# Patient Record
Sex: Male | Born: 1960 | Race: White | Hispanic: Yes | Marital: Married
Health system: Southern US, Community
[De-identification: ages and names within clinical notes are randomized; demographics above are authoritative.]

## PROBLEM LIST (undated history)

## (undated) DIAGNOSIS — E119 Type 2 diabetes mellitus without complications: Secondary | ICD-10-CM

## (undated) DIAGNOSIS — E785 Hyperlipidemia, unspecified: Secondary | ICD-10-CM

## (undated) HISTORY — DX: Hyperlipidemia, unspecified: E78.5

## (undated) HISTORY — DX: Type 2 diabetes mellitus without complications: E11.9

---

## 2009-08-09 ENCOUNTER — Emergency Department (HOSPITAL_COMMUNITY): Admission: EM | Admit: 2009-08-09 | Discharge: 2009-08-09 | Payer: Self-pay | Admitting: Emergency Medicine
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
## 2021-04-24 ENCOUNTER — Telehealth: Payer: BC Managed Care – PPO | Admitting: Family

## 2021-04-24 DIAGNOSIS — M546 Pain in thoracic spine: Secondary | ICD-10-CM

## 2021-04-24 DIAGNOSIS — Z8616 Personal history of COVID-19: Secondary | ICD-10-CM | POA: Diagnosis not present

## 2021-04-24 DIAGNOSIS — R059 Cough, unspecified: Secondary | ICD-10-CM | POA: Diagnosis not present

## 2021-04-24 MED ORDER — PREDNISONE 10 MG (21) PO TBPK: ORAL_TABLET | ORAL | 0 refills | Status: DC

## 2021-04-24 MED ORDER — DICLOFENAC SODIUM 75 MG PO TBEC: 75.0000 mg | DELAYED_RELEASE_TABLET | Freq: Two times a day (BID) | ORAL | 0 refills | Status: DC

## 2021-04-24 MED ORDER — BACLOFEN 10 MG PO TABS: 10.0000 mg | ORAL_TABLET | Freq: Three times a day (TID) | ORAL | 0 refills | Status: DC | PRN

## 2021-04-24 NOTE — Progress Notes (Signed)
Virtual Visit Consent   Austin Singleton, you are scheduled for a virtual visit with a [facility] provider today.     Just as with appointments in the office, your consent must be obtained to participate.  Your consent will be active for this visit and any virtual visit you may have with one of our providers in the next 365 days.     If you have a MyChart account, a copy of this consent can be sent to you electronically.  All virtual visits are billed to your insurance company just like a traditional visit in the office.    As this is a virtual visit, video technology does not allow for your provider to perform a traditional examination.  This may limit your provider's ability to fully assess your condition.  If your provider identifies any concerns that need to be evaluated in person or the need to arrange testing (such as labs, EKG, etc.), we will make arrangements to do so.     Although advances in technology are sophisticated, we cannot ensure that it will always work on either your end or our end.  If the connection with a video visit is poor, the visit may have to be switched to a telephone visit.  With either a video or telephone visit, we are not always able to ensure that we have a secure connection.     I need to obtain your verbal consent now.   Are you willing to proceed with your visit today?    Adem Costlow has provided verbal consent on 04/24/2021 for a virtual visit (video or telephone).   Jannifer Rodney, FNP   Date: 04/24/2021 6:38 PM   Virtual Visit via Video Note   I, Jannifer Rodney, connected with  Austin Singleton  ([id number], Apr 24, 1961) on 04/24/21 at  6:30 PM EDT by a video-enabled telemedicine application and verified that I am speaking with the correct person using two identifiers.  Location: Patient: Virtual Visit Location Patient: Home Provider: Virtual Visit Location Provider: Home   I discussed the limitations of evaluation and management by telemedicine and the  availability of in person appointments. The patient expressed understanding and agreed to proceed.    History of Present Illness: Austin Singleton is a 60 y.o. who identifies as a male who was assigned male at birth, and is being seen today for cough and back pain pain. He had COVID on 04/18/21.  HPI: Cough This is a new problem. The current episode started 1 to 4 weeks ago. The problem has been waxing and waning. The problem occurs every few minutes. The cough is Productive of sputum. Associated symptoms include myalgias, nasal congestion and wheezing. Pertinent negatives include no chills, ear congestion, ear pain, fever, headaches or shortness of breath. Associated symptoms comments: Back pain .   Problems: There are no problems to display for this patient.   Allergies: Not on File Medications:  Current Outpatient Medications:    baclofen (LIORESAL) 10 MG tablet, Take 1 tablet (10 mg total) by mouth 3 (three) times daily as needed for muscle spasms., Disp: 30 each, Rfl: 0   diclofenac (VOLTAREN) 75 MG EC tablet, Take 1 tablet (75 mg total) by mouth 2 (two) times daily., Disp: 30 tablet, Rfl: 0   predniSONE (STERAPRED UNI-PAK 21 TAB) 10 MG (21) TBPK tablet, Use as directed, Disp: 21 tablet, Rfl: 0  Observations/Objective: Patient is well-developed, well-nourished in no acute distress.  Resting comfortably  at home.  Head is normocephalic, atraumatic.  No labored breathing.  Speech is clear and coherent with logical content.  Patient is alert and oriented at baseline.    Assessment and Plan: 1. History of COVID-19  2. Coughing - predniSONE (STERAPRED UNI-PAK 21 TAB) 10 MG (21) TBPK tablet; Use as directed  Dispense: 21 tablet; Refill: 0 - diclofenac (VOLTAREN) 75 MG EC tablet; Take 1 tablet (75 mg total) by mouth 2 (two) times daily.  Dispense: 30 tablet; Refill: 0 - baclofen (LIORESAL) 10 MG tablet; Take 1 tablet (10 mg total) by mouth 3 (three) times daily as needed for muscle  spasms.  Dispense: 30 each; Refill: 0  3. Acute bilateral thoracic back pain - predniSONE (STERAPRED UNI-PAK 21 TAB) 10 MG (21) TBPK tablet; Use as directed  Dispense: 21 tablet; Refill: 0 - diclofenac (VOLTAREN) 75 MG EC tablet; Take 1 tablet (75 mg total) by mouth 2 (two) times daily.  Dispense: 30 tablet; Refill: 0 - baclofen (LIORESAL) 10 MG tablet; Take 1 tablet (10 mg total) by mouth 3 (three) times daily as needed for muscle spasms.  Dispense: 30 each; Refill: 0  Rest ROM exercises encouraged No other NSAID's while taking diclofenac  Sedation  precautions discussed for baclofen Work note given If pain continues will need to be seen in person to rule out pneumonia. Red flags discussed.   Follow Up Instructions: I discussed the assessment and treatment plan with the patient. The patient was provided an opportunity to ask questions and all were answered. The patient agreed with the plan and demonstrated an understanding of the instructions.  A copy of instructions were sent to the patient via MyChart.  The patient was advised to call back or seek an in-person evaluation if the symptoms worsen or if the condition fails to improve as anticipated.  Time:  I spent 12 minutes with the patient via telehealth technology discussing the above problems/concerns.    Jannifer Rodney, FNP

## 2021-05-06 ENCOUNTER — Other Ambulatory Visit: Payer: Self-pay | Admitting: Family

## 2021-05-06 DIAGNOSIS — M546 Pain in thoracic spine: Secondary | ICD-10-CM

## 2021-05-06 DIAGNOSIS — R059 Cough, unspecified: Secondary | ICD-10-CM

## 2021-11-27 ENCOUNTER — Encounter: Payer: Self-pay | Admitting: Physical Therapy

## 2021-11-27 ENCOUNTER — Ambulatory Visit: Payer: BC Managed Care – PPO | Admitting: Physical Therapy

## 2021-11-27 DIAGNOSIS — M542 Cervicalgia: Secondary | ICD-10-CM | POA: Insufficient documentation

## 2021-11-27 DIAGNOSIS — M5413 Radiculopathy, cervicothoracic region: Secondary | ICD-10-CM | POA: Diagnosis present

## 2021-11-27 NOTE — Therapy (Signed)
?OUTPATIENT PHYSICAL THERAPY CERVICAL EVALUATION ? ? ?Patient Name: Austin Singleton ?MRN: [id number] ?DOB:[birth year], 61 y.o., male ?Today's Date: 11/27/2021 ? ? PT End of Session - 11/27/21 1026   ? ? Visit Number 1   ? Number of Visits 13   ? Date for PT Re-Evaluation 01/22/22   ? PT Start Time 1015   ? PT Stop Time 1101   ? PT Time Calculation (min) 46 min   ? Activity Tolerance Patient tolerated treatment well   ? Behavior During Therapy [facility] for tasks assessed/performed   ? ?  ?  ? ?  ? ? ?History reviewed. No pertinent past medical history. ?History reviewed. No pertinent surgical history. ?There are no problems to display for this patient. ? ? ?PCP: No primary care provider on file. ? ?REFERRING PROVIDER: Glendale Chard., MD ? ?REFERRING DIAG: Radiculopathy, cervical region [M54.12] ? ?THERAPY DIAG:  ?Cervicalgia ? ?Radiculopathy, cervicothoracic region ? ?ONSET DATE: January 2023 for neck pain, RUE pain & N/T x 4 years ? ?SUBJECTIVE:                                                                                                                                                                                                        ? ?SUBJECTIVE STATEMENT: ?Pt reports neck pain starting after doing a lot of lifting at work in January 2023 when he was lifting at work that was between 20 - 50# which he had to lift repetitively. He has a hx of RUE pain and N/T that was exacerbated after the event in January. Per pt's wife she reports there has been a number of accidents that have happened through the years starting in  201, and 2012 he had some equipment fall on him. In 2020 he was working on some equipment and the top cover hit him in the head / shoulder. He reports pain stays in the neck on the R side but when working he can feel it in both sides. And N/T into in his RUE and into the hands worse in the morning but improves throughout the day. He reports the N/T seems to be worsening.  ? ?PERTINENT HISTORY:   ?Anxiety ? ?PAIN:  ?Are you having pain? Yes: NPRS scale: 8/10 ?Pain location: R side of th eneck, deltoid, and bicep ?Pain description: burning ?Aggravating factors: getting up in the morning, laying down, doing activity, raising hands over head ?Relieving factors: Taking meds, stretch  ? ?PRECAUTIONS: None ? ?WEIGHT BEARING RESTRICTIONS No ? ?FALLS:  ?Has patient fallen in last 6 months? No ? ?LIVING  ENVIRONMENT: ?Lives with: lives with their family ?Lives in: House/apartment ? ?OCCUPATION: working at a plant ? ?PLOF: Independent with basic ADLs ? ?PATIENT GOALS To decrease pain ? ?OBJECTIVE:  ? ?DIAGNOSTIC FINDINGS:  ?N/A ? ?PATIENT SURVEYS:  ?FOTO 37%, predicted 56% ? ? ?COGNITION: ?Overall cognitive status: Within functional limits for tasks assessed ? ? ?SENSATION: ?WFL ? ?POSTURE:  ?Forwared head posture and keeps head lateral leaned to the L ? ?PALPATION: ?TTP along the R upper trap/ levator scapuale and cerivcal parapsinals with multiple trigger points noted .  ? ?CERVICAL ROM:  ? ?Active ROM A/PROM (deg) ?11/27/2021  ?Flexion 30 P!  ?Extension 30 P!  ?Right lateral flexion 20 P!  ?Left lateral flexion 40  ?Right rotation 55 P!  ?Left rotation 70  ? (Blank rows = not tested) ? ?UE ROM: ? ?Active ROM Right ?11/27/2021 Left ?11/27/2021  ?Shoulder flexion [facility]  ?Shoulder extension [facility]  ?[facility] abduction [facility]  ?Shoulder adduction    ?Shoulder extension [facility]  ?Shoulder internal rotation [facility]  ?Shoulder external rotation [facility]  ?Elbow flexion    ?Elbow extension    ?Wrist flexion    ?Wrist extension    ?Wrist ulnar deviation    ?Wrist radial deviation    ?Wrist pronation    ?Wrist supination    ? (Blank rows = not tested) ? ?UE MMT: ? ?MMT Right ?11/27/2021 Left ?11/27/2021  ?Shoulder flexion 4/5 4/5  ?Shoulder extension    ?Shoulder abduction 4/5 P! 4/5  ?Shoulder adduction    ?Shoulder extension 4/5 4/5  ?Shoulder internal rotation 4-/5 4+/5  ?Shoulder external rotation 4/5 4/5  ?Middle  trapezius    ?Lower trapezius    ?Elbow flexion    ?Elbow extension    ?Wrist flexion    ?Wrist extension    ?Wrist ulnar deviation    ?Wrist radial deviation    ?Wrist pronation    ?Wrist supination    ?Grip strength    ? (Blank rows = not tested) ? ?CERVICAL SPECIAL TESTS:  ?Upper limb tension test (ULTT): Negative, Spurling's test: Positive, and Distraction test: Positive ? ? ? ? ? ? ? ?[facility]  [facility]                                                [facility]27[facility]20[facility] ? ?Therapeutic Exercise: ?Seated chin tuck 1 x 5 holding 10 seconds (demosntration for proper form ?Upper trap/ levator scapulae stretch 2 x 30 sec ?Scapular retraction 1 x 5 holding 5 seconds ?Manual Therapy: ?MTPR along the R upper trap / levator scapulae ?Sub-occipital release ?Self Care: ?Reviewed posture and keeping his head in a nuetral postion in sitting to prevent issues / over use. ? ? ? ?PATIENT EDUCATION:  ?Education details: evaluation findings, POC, goals, HEP with proper form ?Person educated: Patient ?Education method: Explanation, Verbal cues, and Handouts ?Education comprehension: verbalized understanding ? ? ?HOME EXERCISE PROGRAM: ?Access Code: [email or web address] ?URL: [email or web address] ?Date: 11/27/2021 ?Prepared by: Lulu RidingKristoffer Kayona Foor ? ?Exercises ?- Seated Upper Trapezius Stretch  - 1 x daily - 7 x weekly - 3 sets - 2 reps - 30 seconds hold ?- Gentle Levator Scapulae Stretch  - 1 x daily - 7 x weekly - 2 sets - 2 reps - 30 seconds hold ?- Standing Cervical Retraction  - 1 x daily -  7 x weekly - 2 sets - 10 reps ?- Supine Chin Tuck with Towel  - 1 x daily - 7 x weekly - 2 sets - 10 reps - 5 hold ?- Seated Scapular Retraction  - 1 x daily - 7 x weekly - 2 sets - 10 reps - 5 seconds hold ? ?ASSESSMENT: ? ?CLINICAL IMPRESSION: ?Patient is a 61 y.o. M who was seen today for physical therapy evaluation and treatment for Neck pain starting in January reporting exacerbation of neck pain and RUE n/t that  has been present since 2011 which has occurred from work related incidents. He reports N/T that occurs in the morning and improves throughout the day light tough assessment revealed no differenced between L/R UE. He demonstrates limited cervical mobility with R sidebending and R rotation with report of pain with concordant symptoms located at the upper trap. He has TTP along the R upper trap and levator / cervical paraspinals with trigger points, he responded to 3/4 cervical radiculopathy cluster testing suggesting high liklihood of cervical radiculopathy. He would benefit from physical therapy to decrease neck pain, reduce muscle spasm, improve mobility, promote / postural efficiency and maximize his function by addressing the deficits listed.  ? ? ?OBJECTIVE IMPAIRMENTS decreased activity tolerance, decreased endurance, decreased ROM, decreased strength, increased fascial restrictions, increased muscle spasms, improper body mechanics, postural dysfunction, and pain.  ? ?ACTIVITY LIMITATIONS occupation.  ? ?PERSONAL FACTORS Age and 1 comorbidity: time since onset with mulitple injuries  are also affecting patient's functional outcome.  ? ? ?REHAB POTENTIAL: Good ? ?CLINICAL DECISION MAKING: Evolving/moderate complexity ? ?EVALUATION COMPLEXITY: Moderate ? ? ?GOALS: ?Goals reviewed with patient? Yes ? ?SHORT TERM GOALS: Target date: 12/18/2021 ? ?Pt to be IND with initial HEP to assist with therapeutic progression ?Baseline:  ?Goal status: INITIAL ? ?2.  Pt to verbalize and demo efficient posture and lifting mechanics to reduce and prevent neck/ RUE pain ?Baseline:  ?Goal status: INITIAL ? ?LONG TERM GOALS: Target date: 01/08/2022 ? ?Pt to increase R cervical rotation and Sidebending by >/= 10 degrees and maintain all other cervical ROM with </= 4/10 max pain to assist with work related activities and safety with driving  ?Baseline:  ?Goal status: INITIAL ? ?2.  Increase gross shoulder strength to >/= 4+/5 to assist  with functional and efficient lifting mechancis and neck/ shoulder stability  ?Baseline:  ?Goal status: INITIAL ? ?3.  Increase FOTO score to >/= 56% to demo improvement in function ?Baseline:  ?Goal status: INITIAL ?

## 2021-12-04 ENCOUNTER — Encounter: Payer: Self-pay | Admitting: Physical Therapy

## 2021-12-04 ENCOUNTER — Ambulatory Visit: Payer: BC Managed Care – PPO | Admitting: Physical Therapy

## 2021-12-04 DIAGNOSIS — M542 Cervicalgia: Secondary | ICD-10-CM | POA: Diagnosis not present

## 2021-12-04 DIAGNOSIS — M5413 Radiculopathy, cervicothoracic region: Secondary | ICD-10-CM | POA: Insufficient documentation

## 2021-12-04 NOTE — Therapy (Signed)
?OUTPATIENT PHYSICAL THERAPY TREATMENT NOTE ? ? ?Patient Name: Austin Singleton ?MRN: [id number] ?DOB:[birth year], 61 y.o., male ?Today's Date: 12/04/2021 ?  ?PCP: No primary care provider on file. ?  ?REFERRING PROVIDER: Trenda Moots., MD ? ?END OF SESSION:  ? PT End of Session - 12/04/21 0802   ? ? Visit Number 2   ? Number of Visits 13   ? Date for PT Re-Evaluation 01/22/22   ? PT Start Time 0800   ? PT Stop Time 0850   ? PT Time Calculation (min) 50 min   ? ?  ?  ? ?  ? ? ?History reviewed. No pertinent past medical history. ?History reviewed. No pertinent surgical history. ?There are no problems to display for this patient. ? ? ?REFERRING DIAG: Radiculopathy, cervical region [M54.12] ? ?THERAPY DIAG:  ?Cervicalgia ? ?Radiculopathy, cervicothoracic region ? ?PERTINENT HISTORY: Anxiety  ? ?PRECAUTIONS: None  ? ?SUBJECTIVE: I have been doing the exercises. I still have pain. 8/10.  ? ?PAPAIN:  ?Are you having pain? Yes: NPRS scale: 8/10 ?Pain location: R side of th eneck, deltoid, and bicep ?Pain description: burning ?Aggravating factors: getting up in the morning, laying down, doing activity, raising hands over head ?Relieving factors: Taking meds, stretch  ? ? ?OBJECTIVE: (objective measures completed at initial evaluation unless otherwise dated) ? ? ?OBJECTIVE:  ?  ?DIAGNOSTIC FINDINGS:  ?N/A ?  ?PATIENT SURVEYS:  ?FOTO 37%, predicted 56% ?  ?  ?COGNITION: ?Overall cognitive status: Within functional limits for tasks assessed ?  ?  ?SENSATION: ?WFL ?  ?POSTURE:  ?Forwared head posture and keeps head lateral leaned to the L ?  ?PALPATION: ?TTP along the R upper trap/ levator scapuale and cerivcal parapsinals with multiple trigger points noted .   ?  ?CERVICAL ROM:  ?  ?Active ROM A/PROM (deg) ?11/27/2021  ?Flexion 30 P!  ?Extension 30 P!  ?Right lateral flexion 20 P!  ?Left lateral flexion 40  ?Right rotation 55 P!  ?Left rotation 70  ? (Blank rows = not tested) ?  ?UE ROM: ?  ?Active ROM Right ?11/27/2021  Left ?11/27/2021  ?Shoulder flexion [facility] WFL  ?Shoulder extension [facility] WFL  ?Shoulder abduction [facility] WFL  ?Shoulder adduction      ?Shoulder extension [facility] WFL  ?Shoulder internal rotation [facility] WFL  ?Shoulder external rotation [facility] WFL  ?Elbow flexion      ?Elbow extension      ?Wrist flexion      ?Wrist extension      ?Wrist ulnar deviation      ?Wrist radial deviation      ?Wrist pronation      ?Wrist supination      ? (Blank rows = not tested) ?  ?UE MMT: ?  ?MMT Right ?11/27/2021 Left ?11/27/2021  ?Shoulder flexion 4/5 4/5  ?Shoulder extension      ?Shoulder abduction 4/5 P! 4/5  ?Shoulder adduction      ?Shoulder extension 4/5 4/5  ?Shoulder internal rotation 4-/5 4+/5  ?Shoulder external rotation 4/5 4/5  ?Middle trapezius      ?Lower trapezius      ?Elbow flexion      ?Elbow extension      ?Wrist flexion      ?Wrist extension      ?Wrist ulnar deviation      ?Wrist radial deviation      ?Wrist pronation      ?Wrist supination      ?Grip strength      ? (  Blank rows = not tested) ?  ?CERVICAL SPECIAL TESTS:  ?Upper limb tension test (ULTT): Negative, Spurling's test: Positive, and Distraction test: Positive ?  ?  ?  ?  ?  ?  ?  ?TODAY'S TREATMENT:  ?[facility] Adult PT Treatment:                                                DATE: 12/04/2021 ?  ?Therapeutic Exercise: ?Seated chin tuck 1 x 20 holding 5 seconds (demosntration for proper form ?Upper trap/ levator scapulae stretch 3 x 30 sec ?Scapular retraction 1 x 20 holding 5 seconds ?Supine cervical retraction into towel 5 sec 1 x 20 ?Manual Therapy: ?MTPR along the R upper trap / levator scapulae ?Sub-occipital release ?Passive cervical ROM side bend and rotation ?Cervical manual traction -reports decreased pain in right arm  ?Modalities:  ?Cervical Mechanical Traction -  10 minutes (static hold 30 lbs) 6 steps at 5 sec each up and down at 15 degrees  cervical flexion. (No change in arm pain with up to 30 lb pull -reported decreased right neck and upper trap pain during  treatment).  ? ? ?[facility] Adult PT Treatment:                                                DATE: 11/27/2021 ?  ?Therapeutic Exercise: ?Seated chin tuck 1 x 5 holding 10 seconds (demosntration for proper form ?Upper trap/ levator scapulae stretch 2 x 30 sec ?Scapular retraction 1 x 5 holding 5 seconds ?Manual Therapy: ?MTPR along the R upper trap / levator scapulae ?Sub-occipital release ?Self Care: ?Reviewed posture and keeping his head in a nuetral postion in sitting to prevent issues / over use. ?  ?  ?  ?PATIENT EDUCATION:  ?Education details: evaluation findings, POC, goals, HEP with proper form ?Person educated: Patient ?Education method: Explanation, Verbal cues, and Handouts ?Education comprehension: verbalized understanding ?  ?  ?HOME EXERCISE PROGRAM: ?Access Code: [email or web address] ?URL: [email or web address] ?Date: 11/27/2021 ?Prepared by: Starr Lake ?  ?Exercises ?- Seated Upper Trapezius Stretch  - 1 x daily - 7 x weekly - 3 sets - 2 reps - 30 seconds hold ?- Gentle Levator Scapulae Stretch  - 1 x daily - 7 x weekly - 2 sets - 2 reps - 30 seconds hold ?- Standing Cervical Retraction  - 1 x daily - 7 x weekly - 2 sets - 10 reps ?- Supine Chin Tuck with Towel  - 1 x daily - 7 x weekly - 2 sets - 10 reps - 5 hold ?- Seated Scapular Retraction  - 1 x daily - 7 x weekly - 2 sets - 10 reps - 5 seconds hold ?  ?ASSESSMENT: ?  ?CLINICAL IMPRESSION: ?Patient is a 61 y.o. M who was seen today for his first physical therapy treatment for Neck pain starting in January reporting exacerbation of neck pain and RUE n/t that has been present since 2011 which has occurred from work related incidents. He reports compliance with HEP and no change in symptoms. Reviewed HEP and he demonstrates independence with all therex. Continued with cervical PROM and TPR to right upper trap.  Began manual cervical traction which he report feeling improvement in pain.  Trial of mechanical traction performed today to assess  response. No initial improvement in right arm pain today. He would benefit from physical therapy to decrease neck pain, reduce muscle spasm, improve mobility, promote / postural efficiency and maximize his function by addressing the deficits listed.  ?  ?  ?OBJECTIVE IMPAIRMENTS decreased activity tolerance, decreased endurance, decreased ROM, decreased strength, increased fascial restrictions, increased muscle spasms, improper body mechanics, postural dysfunction, and pain.  ?  ?ACTIVITY LIMITATIONS occupation.  ?  ?PERSONAL FACTORS Age and 1 comorbidity: time since onset with mulitple injuries  are also affecting patient's functional outcome.  ?  ?  ?REHAB POTENTIAL: Good ?  ?CLINICAL DECISION MAKING: Evolving/moderate complexity ?  ?EVALUATION COMPLEXITY: Moderate ?  ?  ?GOALS: ?Goals reviewed with patient? Yes ?  ?SHORT TERM GOALS: Target date: 12/18/2021 ?  ?Pt to be IND with initial HEP to assist with therapeutic progression ?Baseline:  ?Goal status: MET 12/04/21 ?  ?2.  Pt to verbalize and demo efficient posture and lifting mechanics to reduce and prevent neck/ RUE pain ?Baseline:  ?Goal status: INITIAL ?  ?LONG TERM GOALS: Target date: 01/08/2022 ?  ?Pt to increase R cervical rotation and Sidebending by >/= 10 degrees and maintain all other cervical ROM with </= 4/10 max pain to assist with work related activities and safety with driving  ?Baseline:  ?Goal status: INITIAL ?  ?2.  Increase gross shoulder strength to >/= 4+/5 to assist with functional and efficient lifting mechancis and neck/ shoulder stability  ?Baseline:  ?Goal status: INITIAL ?  ?3.  Increase FOTO score to >/= 56% to demo improvement in function ?Baseline:  ?Goal status: INITIAL ?  ?4.  Pt to be report no RUE N/T symptoms for >/= 2 weeks to demo improvement in function ?Baseline:  ?Goal status: INITIAL ?  ?5.  Pt to be able to return to work and resume regular activity per pt's personal goals.  ?Baseline:  ?Goal status: INITIAL ?  ?6.  Pt to be  IND with all HEP to maintain and progress current LOF IND ?Baseline:  ?Goal status: INITIAL ?  ?  ?PLAN: ?PT FREQUENCY: 1-2x/week ?  ?PT DURATION: 6 weeks ?  ?PLANNED INTERVENTIONS: Therapeutic exercises, Therapeutic ac

## 2021-12-10 ENCOUNTER — Ambulatory Visit: Payer: BC Managed Care – PPO | Admitting: Physical Therapy

## 2021-12-10 ENCOUNTER — Encounter: Payer: Self-pay | Admitting: Physical Therapy

## 2021-12-10 DIAGNOSIS — M5413 Radiculopathy, cervicothoracic region: Secondary | ICD-10-CM

## 2021-12-10 DIAGNOSIS — M542 Cervicalgia: Secondary | ICD-10-CM

## 2021-12-10 NOTE — Therapy (Signed)
?OUTPATIENT PHYSICAL THERAPY TREATMENT NOTE ? ? ?Patient Name: Austin Singleton ?MRN: [id number] ?DOB:1960/09/11, 61 y.o., male ?Today's Date: 12/10/2021 ?  ?PCP: No primary care provider on file. ?  ?REFERRING PROVIDER: Trenda Moots., MD ? ?END OF SESSION:  ? PT End of Session - 12/10/21 1456   ? ? Visit Number 3   ? Number of Visits 13   ? Date for PT Re-Evaluation 01/22/22   ? PT Start Time 2130   ? PT Stop Time 1550   ? PT Time Calculation (min) 51 min   ? Activity Tolerance Patient tolerated treatment well   ? Behavior During Therapy [facility] for tasks assessed/performed   ? ?  ?  ? ?  ? ? ? ?History reviewed. No pertinent past medical history. ?History reviewed. No pertinent surgical history. ?There are no problems to display for this patient. ? ? ?REFERRING DIAG: Radiculopathy, cervical region [M54.12] ? ?THERAPY DIAG:  ?Cervicalgia ? ?Radiculopathy, cervicothoracic region ? ?PERTINENT HISTORY: Anxiety  ? ?PRECAUTIONS: None  ? ?SUBJECTIVE: "after the last session the referred symptoms down the arm went way for about 3 days, the pain returned back today. I am able to drive better and hold the wheel better." ? ?PAIN:  ?Are you having pain? Yes: NPRS scale: 8/10 ?Pain location: R side of th eneck, deltoid, and bicep ?Pain description: burning ?Aggravating factors: getting up in the morning, laying down, doing activity, raising hands over head ?Relieving factors: Taking meds, stretch  ? ? ?OBJECTIVE: (objective measures completed at initial evaluation unless otherwise dated) ? ? ?OBJECTIVE:  ?  ?DIAGNOSTIC FINDINGS:  ?N/A ?  ?PATIENT SURVEYS:  ?FOTO 37%, predicted 56% ?  ?  ?COGNITION: ?Overall cognitive status: Within functional limits for tasks assessed ?  ?  ?SENSATION: ?WFL ?  ?POSTURE:  ?Forwared head posture and keeps head lateral leaned to the L ?  ?PALPATION: ?TTP along the R upper trap/ levator scapuale and cerivcal parapsinals with multiple trigger points noted .   ?  ?CERVICAL ROM:  ?  ?Active ROM A/PROM  (deg) ?11/27/2021  ?Flexion 30 P!  ?Extension 30 P!  ?Right lateral flexion 20 P!  ?Left lateral flexion 40  ?Right rotation 55 P!  ?Left rotation 70  ? (Blank rows = not tested) ?  ?UE ROM: ?  ?Active ROM Right ?11/27/2021 Left ?11/27/2021  ?Shoulder flexion [facility] WFL  ?Shoulder extension [facility] WFL  ?Shoulder abduction [facility] WFL  ?Shoulder adduction      ?Shoulder extension [facility] WFL  ?Shoulder internal rotation [facility] WFL  ?Shoulder external rotation [facility] WFL  ?Elbow flexion      ?Elbow extension      ?Wrist flexion      ?Wrist extension      ?Wrist ulnar deviation      ?Wrist radial deviation      ?Wrist pronation      ?Wrist supination      ? (Blank rows = not tested) ?  ?UE MMT: ?  ?MMT Right ?11/27/2021 Left ?11/27/2021  ?Shoulder flexion 4/5 4/5  ?Shoulder extension      ?Shoulder abduction 4/5 P! 4/5  ?Shoulder adduction      ?Shoulder extension 4/5 4/5  ?Shoulder internal rotation 4-/5 4+/5  ?Shoulder external rotation 4/5 4/5  ?Middle trapezius      ?Lower trapezius      ?Elbow flexion      ?Elbow extension      ?Wrist flexion      ?Wrist extension      ?  Wrist ulnar deviation      ?Wrist radial deviation      ?Wrist pronation      ?Wrist supination      ?Grip strength      ? (Blank rows = not tested) ?  ?CERVICAL SPECIAL TESTS:  ?Upper limb tension test (ULTT): Negative, Spurling's test: Positive, and Distraction test: Positive ?  ?  ?  ?  ?  ?TODAY'S TREATMENT:  ? ?[place] Adult PT Treatment:                                                DATE: 12/10/2021 ?Therapeutic Exercise: ?UBE L4 x 4 min (FWD/BWD x 2 min ea) ?Upper trap/ levator scapulae stretch 1 x 30 sec ea on R only ?Chin tuck head lift 10 x holding 15 seconds ?Horizontal abduction 2 x 15 with GTB  ?Manual Therapy: ?Bil first rib inferior mobs grade IV ?T1-T9 PA grade IV with multiple cavitation noted ?IASTM along the R upper trap/ levator scapulae ? ?Trigger Point Dry-Needling  ?Treatment instructions: Expect mild to moderate muscle soreness. S/S of pneumothorax if  dry needled over a lung field, and to seek immediate medical attention should they occur. Patient verbalized understanding of these instructions and education. ? ?Patient Consent Given: Yes ?Education handout provided: Yes ?Muscles treated: R upper trap  ?Electrical stimulation performed: No ?Parameters: N/A ?Treatment response/outcome: twitch response and reduction tension noted ?Modalities: ?Cervical Mechanical Traction -  10 minutes Max 30 lbs, min 22lbs 6 steps at 10 sec each up and down at 15 degrees  cervical flexion. ? ? ?[facility] Adult PT Treatment:                                                DATE: 12/04/2021 ?Therapeutic Exercise: ?Seated chin tuck 1 x 20 holding 5 seconds (demosntration for proper form ?Upper trap/ levator scapulae stretch 3 x 30 sec ?Scapular retraction 1 x 20 holding 5 seconds ?Supine cervical retraction into towel 5 sec 1 x 20 ?Manual Therapy: ?MTPR along the R upper trap / levator scapulae ?Sub-occipital release ?Passive cervical ROM side bend and rotation ?Cervical manual traction -reports decreased pain in right arm  ?Modalities:  ?Cervical Mechanical Traction -  10 minutes (static hold 30 lbs) 6 steps at 5 sec each up and down at 15 degrees  cervical flexion. (No change in arm pain with up to 30 lb pull -reported decreased right neck and upper trap pain during treatment).  ? ? ?[facility] Adult PT Treatment:                                                DATE: 11/27/2021 ?  ?Therapeutic Exercise: ?Seated chin tuck 1 x 5 holding 10 seconds (demosntration for proper form ?Upper trap/ levator scapulae stretch 2 x 30 sec ?Scapular retraction 1 x 5 holding 5 seconds ?Manual Therapy: ?MTPR along the R upper trap / levator scapulae ?Sub-occipital release ?Self Care: ?Reviewed posture and keeping his head in a nuetral postion in sitting to prevent issues / over use. ?  ?  ?  ?PATIENT  EDUCATION:  ?Education details: evaluation findings, POC, goals, HEP with proper form ?Person educated:  Patient ?Education method: Explanation, Verbal cues, and Handouts ?Education comprehension: verbalized understanding ?  ?  ?HOME EXERCISE PROGRAM: ?Access Code: [email or web address] ?URL: [email or web address] ?Date: 11/27/2021 ?Prepared by: Starr Lake ?  ?Exercises ?- Seated Upper Trapezius Stretch  - 1 x daily - 7 x weekly - 3 sets - 2 reps - 30 seconds hold ?- Gentle Levator Scapulae Stretch  - 1 x daily - 7 x weekly - 2 sets - 2 reps - 30 seconds hold ?- Standing Cervical Retraction  - 1 x daily - 7 x weekly - 2 sets - 10 reps ?- Supine Chin Tuck with Towel  - 1 x daily - 7 x weekly - 2 sets - 10 reps - 5 hold ?- Seated Scapular Retraction  - 1 x daily - 7 x weekly - 2 sets - 10 reps - 5 seconds hold ?  ?ASSESSMENT: ?  ?CLINICAL IMPRESSION: ?Pt arrives to session reporting improvement in his N/T but continues to report pain located at the upper trap/ levator scapulae. Educated and consent was given for TPDN focusing on the R upper trap followed with IASTM. Continued working on DNF progressing to chin tuck head lift, and posterior should strengthening. Conitnued using mechanical traction which he continued to report improvement in pain with pain dropping to 6/10 compared to an 8/10 at the beginning of the session.  ?  ?  ?OBJECTIVE IMPAIRMENTS decreased activity tolerance, decreased endurance, decreased ROM, decreased strength, increased fascial restrictions, increased muscle spasms, improper body mechanics, postural dysfunction, and pain.  ?  ?ACTIVITY LIMITATIONS occupation.  ?  ?PERSONAL FACTORS Age and 1 comorbidity: time since onset with mulitple injuries  are also affecting patient's functional outcome.  ?  ?  ?REHAB POTENTIAL: Good ?  ?CLINICAL DECISION MAKING: Evolving/moderate complexity ?  ?EVALUATION COMPLEXITY: Moderate ?  ?  ?GOALS: ?Goals reviewed with patient? Yes ?  ?SHORT TERM GOALS: Target date: 12/18/2021 ?  ?Pt to be IND with initial HEP to assist with therapeutic  progression ?Baseline:  ?Goal status: MET 12/04/21 ?  ?2.  Pt to verbalize and demo efficient posture and lifting mechanics to reduce and prevent neck/ RUE pain ?Baseline:  ?Goal status: INITIAL ?  ?LONG TERM GOALS: Target date: 01/08/2022 ?  ?

## 2021-12-12 ENCOUNTER — Ambulatory Visit: Payer: BC Managed Care – PPO | Admitting: Physical Therapy

## 2021-12-12 ENCOUNTER — Encounter: Payer: Self-pay | Admitting: Physical Therapy

## 2021-12-12 DIAGNOSIS — M5413 Radiculopathy, cervicothoracic region: Secondary | ICD-10-CM

## 2021-12-12 DIAGNOSIS — M542 Cervicalgia: Secondary | ICD-10-CM | POA: Diagnosis not present

## 2021-12-12 NOTE — Therapy (Signed)
?OUTPATIENT PHYSICAL THERAPY TREATMENT NOTE ? ? ?Patient Name: Austin Singleton ?MRN: [id number] ?DOB:July 07, 1961, 61 y.o., male ?Today's Date: 12/12/2021 ?  ?PCP: No primary care provider on file. ?  ?REFERRING PROVIDER: Glendale Chard., MD ? ?END OF SESSION:  ? PT End of Session - 12/12/21 1416   ? ? Visit Number 4   ? Number of Visits 13   ? Date for PT Re-Evaluation 01/22/22   ? PT Start Time 1413   ? PT Stop Time 1510   ? PT Time Calculation (min) 57 min   ? Activity Tolerance Patient tolerated treatment well   ? Behavior During Therapy [facility] for tasks assessed/performed   ? ?  ?  ? ?  ? ? ? ? ?History reviewed. No pertinent past medical history. ?History reviewed. No pertinent surgical history. ?There are no problems to display for this patient. ? ? ?REFERRING DIAG: Radiculopathy, cervical region [M54.12] ? ?THERAPY DIAG:  ?Cervicalgia ? ?Radiculopathy, cervicothoracic region ? ?PERTINENT HISTORY: Anxiety  ? ?PRECAUTIONS: None ?SUBJECTIVE: "The TPDN helped and the traction helped too,  ? ?PAIN:  ?Are you having pain? Yes: NPRS scale: 6/10 ?Pain location: R side of th eneck, deltoid, and bicep ?Pain description: burning ?Aggravating factors: getting up in the morning, laying down, doing activity, raising hands over head ?Relieving factors: Taking meds, stretch  ? ? ?OBJECTIVE: (objective measures completed at initial evaluation unless otherwise dated) ? ? ?OBJECTIVE:  ?  ?DIAGNOSTIC FINDINGS:  ?N/A ?  ?PATIENT SURVEYS:  ?FOTO 37%, predicted 56% ?  ?  ?COGNITION: ?Overall cognitive status: Within functional limits for tasks assessed ?  ?  ?SENSATION: ?WFL ?  ?POSTURE:  ?Forwared head posture and keeps head lateral leaned to the L ?  ?PALPATION: ?TTP along the R upper trap/ levator scapuale and cerivcal parapsinals with multiple trigger points noted .   ?  ?CERVICAL ROM:  ?  ?Active ROM A/PROM (deg) ?11/27/2021  ?Flexion 30 P!  ?Extension 30 P!  ?Right lateral flexion 20 P!  ?Left lateral flexion 40  ?Right rotation 55  P!  ?Left rotation 70  ? (Blank rows = not tested) ?  ?UE ROM: ?  ?Active ROM Right ?11/27/2021 Left ?11/27/2021  ?Shoulder flexion [facility] WFL  ?Shoulder extension [facility] WFL  ?Shoulder abduction [facility] WFL  ?Shoulder adduction      ?Shoulder extension [facility] WFL  ?Shoulder internal rotation [facility] WFL  ?Shoulder external rotation [facility] WFL  ?Elbow flexion      ?Elbow extension      ?Wrist flexion      ?Wrist extension      ?Wrist ulnar deviation      ?Wrist radial deviation      ?Wrist pronation      ?Wrist supination      ? (Blank rows = not tested) ?  ?UE MMT: ?  ?MMT Right ?11/27/2021 Left ?11/27/2021  ?Shoulder flexion 4/5 4/5  ?Shoulder extension      ?Shoulder abduction 4/5 P! 4/5  ?Shoulder adduction      ?Shoulder extension 4/5 4/5  ?Shoulder internal rotation 4-/5 4+/5  ?Shoulder external rotation 4/5 4/5  ?Middle trapezius      ?Lower trapezius      ?Elbow flexion      ?Elbow extension      ?Wrist flexion      ?Wrist extension      ?Wrist ulnar deviation      ?Wrist radial deviation      ?Wrist pronation      ?  Wrist supination      ?Grip strength      ? (Blank rows = not tested) ?  ?CERVICAL SPECIAL TESTS:  ?Upper limb tension test (ULTT): Negative, Spurling's test: Positive, and Distraction test: Positive ?  ?  ?  ?  ?  ?TODAY'S TREATMENT:  ? ?[facility] Adult PT Treatment:                                                DATE: 12/12/2021 ?Therapeutic Exercise: ?UBE L5 x 4 min (FWD/BWD x 2 min ea) ?Rhomboid stretch 2 x 30 with arms crossed on FM ?Foam roll routine, ceiing punches horizontal abd/adduction, alternating ceiling punches, X to Y and back stroke 1 x 15 ea ?Manual Therapy: ?Bil first rib inferior mobs grade IV ?T1-T9 PA grade IV with multiple cavitation noted ?IASTM along the R upper trap/ levator scapulae ?Trigger Point Dry-Needling  ?Treatment instructions: Expect mild to moderate muscle soreness. S/S of pneumothorax if dry needled over a lung field, and to seek immediate medical attention should they occur. Patient  verbalized understanding of these instructions and education. ? ?Patient Consent Given: Yes ?Education handout provided: Previously provided ?Muscles treated: R thoracic multifidi T4-T8, R cervical multifidi C3-C5 ?Electrical stimulation performed: Yes ?Parameters:  CPS Level 20 x 8 min adjusting intensity PRN ?Treatment response/outcome: twitch response and release of tension ?Modalities: ?Cervical Mechanical Traction -  10 minutes Max 30 lbs, min 25lbs 6 steps at 10 sec each up and down at 15 degrees  cervical flexion. ? ? ? ?[facility] Adult PT Treatment:                                                DATE: 12/10/2021 ?Therapeutic Exercise: ?UBE L4 x 4 min (FWD/BWD x 2 min ea) ?Upper trap/ levator scapulae stretch 1 x 30 sec ea on R only ?Chin tuck head lift 10 x holding 15 seconds ?Horizontal abduction 2 x 15 with GTB  ?Manual Therapy: ?Bil first rib inferior mobs grade IV ?T1-T9 PA grade IV with multiple cavitation noted ?IASTM along the R upper trap/ levator scapulae ? ?Trigger Point Dry-Needling  ?Treatment instructions: Expect mild to moderate muscle soreness. S/S of pneumothorax if dry needled over a lung field, and to seek immediate medical attention should they occur. Patient verbalized understanding of these instructions and education. ? ?Patient Consent Given: Yes ?Education handout provided: Yes ?Muscles treated: R upper trap  ?Electrical stimulation performed: No ?Parameters: N/A ?Treatment response/outcome: twitch response and reduction tension noted ?Modalities: ?Cervical Mechanical Traction -  10 minutes Max 30 lbs, min 22lbs 6 steps at 10 sec each up and down at 15 degrees  cervical flexion. ? ? ?[facility] Adult PT Treatment:                                                DATE: 12/04/2021 ?Therapeutic Exercise: ?Seated chin tuck 1 x 20 holding 5 seconds (demosntration for proper form ?Upper trap/ levator scapulae stretch 3 x 30 sec ?Scapular retraction 1 x 20 holding 5 seconds ?Supine cervical retraction into  towel 5 sec 1  x 20 ?Manual Therapy: ?MTPR along the R upper trap / levator scapulae ?Sub-occipital release ?Passive cervical ROM side bend and rotation ?Cervical manual traction -reports decreased pain in right arm  ?Modalities:  ?Cervical Mechanical Traction -  10 minutes (static hold 30 lbs) 6 steps at 5 sec each up and down at 15 degrees  cervical flexion. (No change in arm pain with up to 30 lb pull -reported decreased right neck and upper trap pain during treatment).  ? ?  ?  ?  ?PATIENT EDUCATION:  ?Education details: evaluation findings, POC, goals, HEP with proper form ?Person educated: Patient ?Education method: Explanation, Verbal cues, and Handouts ?Education comprehension: verbalized understanding ?  ?  ?HOME EXERCISE PROGRAM: ?Access Code: [email or web address] ?URL: [email or web address] ?Date: 11/27/2021 ?Prepared by: Lulu RidingKristoffer Tajee Savant ?  ?Exercises ?- Seated Upper Trapezius Stretch  - 1 x daily - 7 x weekly - 3 sets - 2 reps - 30 seconds hold ?- Gentle Levator Scapulae Stretch  - 1 x daily - 7 x weekly - 2 sets - 2 reps - 30 seconds hold ?- Standing Cervical Retraction  - 1 x daily - 7 x weekly - 2 sets - 10 reps ?- Supine Chin Tuck with Towel  - 1 x daily - 7 x weekly - 2 sets - 10 reps - 5 hold ?- Seated Scapular Retraction  - 1 x daily - 7 x weekly - 2 sets - 10 reps - 5 seconds hold ?  ?ASSESSMENT: ?  ?CLINICAL IMPRESSION: ?Pt reports decreased pain today noting 6/10 compared to previous session. Continued TPDN combined with E-stim for R cervical / thoracic multifidi followed with IASTM techniques and rib/ thoracic mobs. Worked on scapulothoracic activation. ?  ?  ?OBJECTIVE IMPAIRMENTS decreased activity tolerance, decreased endurance, decreased ROM, decreased strength, increased fascial restrictions, increased muscle spasms, improper body mechanics, postural dysfunction, and pain.  ?  ?ACTIVITY LIMITATIONS occupation.  ?  ?PERSONAL FACTORS Age and 1 comorbidity: time since onset with mulitple  injuries  are also affecting patient's functional outcome.  ?  ?  ?REHAB POTENTIAL: Good ?  ?CLINICAL DECISION MAKING: Evolving/moderate complexity ?  ?EVALUATION COMPLEXITY: Moderate ?  ?  ?GOALS: ?Goals re

## 2021-12-17 ENCOUNTER — Ambulatory Visit: Payer: BC Managed Care – PPO | Admitting: Physical Therapy

## 2021-12-17 ENCOUNTER — Encounter: Payer: Self-pay | Admitting: Physical Therapy

## 2021-12-17 DIAGNOSIS — M5413 Radiculopathy, cervicothoracic region: Secondary | ICD-10-CM

## 2021-12-17 DIAGNOSIS — M542 Cervicalgia: Secondary | ICD-10-CM

## 2021-12-17 NOTE — Therapy (Signed)
?OUTPATIENT PHYSICAL THERAPY TREATMENT NOTE ? ? ?Patient Name: Austin ShoeFelix Danh ?MRN: [id number] ?DOB:12/17/1960, 61 y.o., male ?Today's Date: 12/17/2021 ?  ?PCP: No primary care provider on file. ?  ?REFERRING PROVIDER: [place] Chardegan, Conor M., MD ? ?END OF SESSION:  ? PT End of Session - 12/17/21 1149   ? ? Visit Number 5   ? Number of Visits 13   ? Date for PT Re-Evaluation 01/22/22   ? PT Start Time 1148   ? PT Stop Time 1235   ? PT Time Calculation (min) 47 min   ? ?  ?  ? ?  ? ? ? ? ?History reviewed. No pertinent past medical history. ?History reviewed. No pertinent surgical history. ?There are no problems to display for this patient. ? ? ?REFERRING DIAG: Radiculopathy, cervical region [M54.12] ? ?THERAPY DIAG:  ?Cervicalgia ? ?Radiculopathy, cervicothoracic region ? ?PERTINENT HISTORY: Anxiety  ? ?PRECAUTIONS: None ?SUBJECTIVE: "The TPDN helped and the traction helped too,  ? ?PAIN:  ?Are you having pain? Yes: NPRS scale: 6/10 ?Pain location: R side of th eneck, deltoid, and bicep ?Pain description: burning ?Aggravating factors: getting up in the morning, laying down, doing activity, raising hands over head ?Relieving factors: Taking meds, stretch  ? ? ?OBJECTIVE: (objective measures completed at initial evaluation unless otherwise dated) ? ? ?OBJECTIVE:  ?  ?DIAGNOSTIC FINDINGS:  ?N/A ?  ?PATIENT SURVEYS:  ?FOTO 37%, predicted 56% ?  ?  ?COGNITION: ?Overall cognitive status: Within functional limits for tasks assessed ?  ?  ?SENSATION: ?WFL ?  ?POSTURE:  ?Forwared head posture and keeps head lateral leaned to the L ?  ?PALPATION: ?TTP along the R upper trap/ levator scapuale and cerivcal parapsinals with multiple trigger points noted .   ?  ?CERVICAL ROM:  ?  ?Active ROM A/PROM (deg) ?11/27/2021  ?Flexion 30 P!  ?Extension 30 P!  ?Right lateral flexion 20 P!  ?Left lateral flexion 40  ?Right rotation 55 P!  ?Left rotation 70  ? (Blank rows = not tested) ?  ?UE ROM: ?  ?Active ROM Right ?11/27/2021 Left ?11/27/2021   ?[facility]  ?[facility]  ?[facility] abduction [facility]  ?Shoulder adduction      ?Should[facility]  ?Shoulder internal rotation [facility]  ?Should[facility] external [facility]  ?Elbow flexion      ?Elbow extension      ?Wrist flexion      ?Wrist extension      ?Wrist ulnar deviation      ?Wrist radial deviation      ?Wrist pronation      ?Wrist supination      ? (Blank rows = not tested) ?  ?UE MMT: ?  ?MMT Right ?11/27/2021 Left ?11/27/2021  ?Shoulder flexion 4/5 4/5  ?Shoulder extension      ?Shoulder abduction 4/5 P! 4/5  ?Shoulder adduction      ?Shoulder extension 4/5 4/5  ?Shoulder internal rotation 4-/5 4+/5  ?Shoulder external rotation 4/5 4/5  ?Middle trapezius      ?Lower trapezius      ?Elbow flexion      ?Elbow extension      ?Wrist flexion      ?Wrist extension      ?Wrist ulnar deviation      ?Wrist radial deviation      ?Wrist pronation      ?Wrist supination      ?Grip strength      ? (Blank rows =  not tested) ?  ?CERVICAL SPECIAL TESTS:  ?Upper limb tension test (ULTT): Negative, Spurling's test: Positive, and Distraction test: Positive ?  ?  ?  ?  ?  ?TODAY'S TREATMENT:  ?[facility] Adult PT Treatment:                                                DATE: 12/17/21 ?Therapeutic Exercise: ?UBE level 3 x 3 min each way ?Head lift ( with cues on proper form) 10 sec x 6  ?Supine Green band horizontals and diagonals x 15 ?Supine green band ER bilat x 15  ?Levator and upper trap stretches  ? ?Cervical Mechanical Traction -  10 minutes Max 30 lbs, min 25lbs 6 steps at 5 sec each up and down at 15 degrees  cervical flexion. ?Self Care: ?Tennis ball verses theracane for self TPR ? ?[facility] Adult [facility] Treatment:                                                DATE: 12/12/2021 ?Therapeutic Exercise: ?UBE L5 x 4 min (FWD/BWD x 2 min ea) ?Rhomboid stretch 2 x 30 with arms crossed on FM ?Foam roll routine, ceiing punches horizontal abd/adduction, alternating ceiling punches, X to Y and back  stroke 1 x 15 ea ?Manual Therapy: ?Bil first rib inferior mobs grade IV ?T1-T9 PA grade IV with multiple cavitation noted ?IASTM along the R upper trap/ levator scapulae ?Trigger Point Dry-Needling  ?Treatment instructions: Expect mild to moderate muscle soreness. S/S of pneumothorax if dry needled over a lung field, and to seek immediate medical attention should they occur. Patient verbalized understanding of these instructions and education. ? ?Patient Consent Given: Yes ?Education handout provided: Previously provided ?Muscles treated: R thoracic multifidi T4-T8, R cervical multifidi C3-C5 ?Electrical stimulation performed: Yes ?Parameters:  CPS Level 20 x 8 min adjusting intensity PRN ?Treatment response/outcome: twitch response and release of tension ?Modalities: ?Cervical Mechanical Traction -  10 minutes Max 30 lbs, min 25lbs 6 steps at 10 sec each up and down at 15 degrees  cervical flexion. ? ? ? ?[facility] Adult PT Treatment:                                                DATE: 12/10/2021 ?Therapeutic Exercise: ?UBE L4 x 4 min (FWD/BWD x 2 min ea) ?Upper trap/ levator scapulae stretch 1 x 30 sec ea on R only ?Chin tuck head lift 10 x holding 15 seconds ?Horizontal abduction 2 x 15 with GTB  ?Manual Therapy: ?Bil first rib inferior mobs grade IV ?T1-T9 PA grade IV with multiple cavitation noted ?IASTM along the R upper trap/ levator scapulae ? ?Trigger Point Dry-Needling  ?Treatment instructions: Expect mild to moderate muscle soreness. S/S of pneumothorax if dry needled over a lung field, and to seek immediate medical attention should they occur. Patient verbalized understanding of these instructions and education. ? ?Patient Consent Given: Yes ?Education handout provided: Yes ?Muscles treated: R upper trap  ?Electrical stimulation performed: No ?Parameters: N/A ?Treatment response/outcome: twitch response and reduction tension noted ?Modalities: ?Cervical Mechanical Traction -  10  minutes Max 30 lbs, min 22lbs 6  steps at 10 sec each up and down at 15 degrees  cervical flexion. ? ? ?[facility] Adult PT Treatment:                                                DATE: 12/04/2021 ?Therapeutic Exercise: ?Seated chin tuck 1 x 20 holding 5 seconds (demosntration for proper form ?Upper trap/ levator scapulae stretch 3 x 30 sec ?Scapular retraction 1 x 20 holding 5 seconds ?Supine cervical retraction into towel 5 sec 1 x 20 ?Manual Therapy: ?MTPR along the R upper trap / levator scapulae ?Sub-occipital release ?Passive cervical ROM side bend and rotation ?Cervical manual traction -reports decreased pain in right arm  ?Modalities:  ?Cervical Mechanical Traction -  10 minutes (static hold 30 lbs) 6 steps at 5 sec each up and down at 15 degrees  cervical flexion. (No change in arm pain with up to 30 lb pull -reported decreased right neck and upper trap pain during treatment).  ? ?  ?  ?  ?PATIENT EDUCATION:  ?Education details: evaluation findings, POC, goals, HEP with proper form ?Person educated: Patient ?Education method: Explanation, Verbal cues, and Handouts ?Education comprehension: verbalized understanding ?  ?  ?HOME EXERCISE PROGRAM: ?Access Code: [email or web address] ?URL: [email or web address] ?Date: 11/27/2021 ?Prepared by: Lulu Riding ?  ?Exercises ?- Seated Upper Trapezius Stretch  - 1 x daily - 7 x weekly - 3 sets - 2 reps - 30 seconds hold ?- Gentle Levator Scapulae Stretch  - 1 x daily - 7 x weekly - 2 sets - 2 reps - 30 seconds hold ?- Standing Cervical Retraction  - 1 x daily - 7 x weekly - 2 sets - 10 reps ?- Supine Chin Tuck with Towel  - 1 x daily - 7 x weekly - 2 sets - 10 reps - 5 hold ?- Seated Scapular Retraction  - 1 x daily - 7 x weekly - 2 sets - 10 reps - 5 seconds hold ?  ?ASSESSMENT: ?  ?CLINICAL IMPRESSION: ?Pt reports pain is still a 6/10. He reports arm pain is present only in the morning. Most complaint is of periscap pain and upper trap pain on the right. He reports improvement with decreased TPDN  and cervical traction however pain always returns.  Worked on scapulothoracic activation and instructed pt in self TPR with theracane vs tennis ball. He reports plan to purchase one for home. He has tennis bal

## 2021-12-19 ENCOUNTER — Ambulatory Visit: Payer: BC Managed Care – PPO | Admitting: Physical Therapy

## 2021-12-19 ENCOUNTER — Encounter: Payer: Self-pay | Admitting: Physical Therapy

## 2021-12-19 DIAGNOSIS — M5413 Radiculopathy, cervicothoracic region: Secondary | ICD-10-CM

## 2021-12-19 DIAGNOSIS — M542 Cervicalgia: Secondary | ICD-10-CM | POA: Diagnosis not present

## 2021-12-19 NOTE — Therapy (Signed)
OUTPATIENT PHYSICAL THERAPY TREATMENT NOTE   Patient Name: Minoru Chap MRN: [id number] DOB:[birth year], 61 y.o., male Today's Date: 12/19/2021   PCP: No primary care provider on file.   REFERRING PROVIDER: Trenda Moots., MD  END OF SESSION:   PT End of Session - 12/19/21 1148     Visit Number 6    Number of Visits 13    Date for PT Re-Evaluation 01/22/22    PT Start Time 1146    PT Stop Time 1242    PT Time Calculation (min) 56 min    Activity Tolerance Patient tolerated treatment well    Behavior During Therapy [facility] for tasks assessed/performed                History reviewed. No pertinent past medical history. History reviewed. No pertinent surgical history. There are no problems to display for this patient.   REFERRING DIAG: Radiculopathy, cervical region [M54.12]  THERAPY DIAG:  Cervicalgia  Radiculopathy, cervicothoracic region  PERTINENT HISTORY: Anxiety   PRECAUTIONS: None SUBJECTIVE: " I was trying to put some screws in over head and felt pain and N/T into the RUE a little bit. I still feel this the pain with any lifting especially with the RUE."  PAIN:  Are you having pain? Yes: NPRS scale: 7/10 Pain location: R side of th eneck, deltoid, and bicep Pain description: burning Aggravating factors: getting up in the morning, laying down, doing activity, raising hands over head Relieving factors: Taking meds, stretch    OBJECTIVE: (objective measures completed at initial evaluation unless otherwise dated)   OBJECTIVE:    DIAGNOSTIC FINDINGS:  N/A   PATIENT SURVEYS:  FOTO 37%, predicted 56% 12/19/2021  45%      COGNITION: Overall cognitive status: Within functional limits for tasks assessed     SENSATION: WFL   POSTURE:  Forwared head posture and keeps head lateral leaned to the L   PALPATION: TTP along the R upper trap/ levator scapuale and cerivcal parapsinals with multiple trigger points noted .     CERVICAL ROM:    Active  ROM A/PROM (deg) 11/27/2021  Flexion 30 P!  Extension 30 P!  Right lateral flexion 20 P!  Left lateral flexion 40  Right rotation 55 P!  Left rotation 70   (Blank rows = not tested)   UE ROM:   Active ROM Right 11/27/2021 Left 11/27/2021  Shoulder flexion [facility]  Shoulder extension [facility]  Shoulder abduction [facility]  Shoulder adduction      Shoulder extension [facility]  Shoulder internal rotation [facility]  Shoulder external rotation [facility]  Elbow flexion      Elbow extension      Wrist flexion      Wrist extension      Wrist ulnar deviation      Wrist radial deviation      Wrist pronation      Wrist supination       (Blank rows = not tested)   UE MMT:   MMT Right 11/27/2021 Left 11/27/2021  Shoulder flexion 4/5 4/5  Shoulder extension      Shoulder abduction 4/5 P! 4/5  Shoulder adduction      Shoulder extension 4/5 4/5  Shoulder internal rotation 4-/5 4+/5  Shoulder external rotation 4/5 4/5  Middle trapezius      Lower trapezius      Elbow flexion      Elbow extension      Wrist flexion  Wrist extension      Wrist ulnar deviation      Wrist radial deviation      Wrist pronation      Wrist supination      Grip strength       (Blank rows = not tested)   CERVICAL SPECIAL TESTS:  Upper limb tension test (ULTT): Negative, Spurling's test: Positive, and Distraction test: Positive           TODAY'S TREATMENT:  [facility] Adult PT Treatment:                                                DATE: 12/19/2021 Therapeutic Exercise: Levator scapoulae Rhomboid stretch    Manual Therapy: Bil first rib inferior mobs grade IV T1-T9 PA grade IV with multiple cavitation noted IASTM along the R upper trap/ levator scapulae  Trigger Point Dry-Needling  Treatment instructions: Expect mild to moderate muscle soreness. S/S of pneumothorax if dry needled over a lung field, and to seek immediate medical attention should they occur. Patient verbalized understanding of these  instructions and education.  Patient Consent Given: Yes Education handout provided: Previously provided Muscles treated: R thoracic multifidi T4-T8, R cervical multifidi C3-C5 Electrical stimulation performed: Yes Parameters: CPS Level 20 x 10 min adjusting intensity PRN Treatment response/outcome: twitch response and release of tension Modalities: Cervical Mechanical Traction -  10 minutes Max 30 lbs, min 25lbs 6 steps at 10 sec each up and down at 15 degrees  cervical flexion. Self Care: Reviewed sleeping position to reduce tension on the neck keeping his head inline with his shoulders. To utilize chin tuck when performing shoulder and overhead activities to help take some tension of the neck.    [facility] Adult PT Treatment:                                                DATE: 12/17/21 Therapeutic Exercise: UBE level 3 x 3 min each way Head lift ( with cues on proper form) 10 sec x 6  Supine Green band horizontals and diagonals x 15 Supine green band ER bilat x 15  Levator and upper trap stretches   Cervical Mechanical Traction -  10 minutes Max 30 lbs, min 25lbs 6 steps at 5 sec each up and down at 15 degrees  cervical flexion. Self Care: Tennis ball verses theracane for self TPR  [facility] Adult PT Treatment:                                                DATE: 12/12/2021 Therapeutic Exercise: UBE L5 x 4 min (FWD/BWD x 2 min ea) Rhomboid stretch 2 x 30 with arms crossed on FM Foam roll routine, ceiing punches horizontal abd/adduction, alternating ceiling punches, X to Y and back stroke 1 x 15 ea Manual Therapy: Bil first rib inferior mobs grade IV T1-T9 PA grade IV with multiple cavitation noted IASTM along the R upper trap/ levator scapulae Trigger Point Dry-Needling  Treatment instructions: Expect mild to moderate muscle soreness. S/S of pneumothorax if dry needled over a lung field, and to seek  immediate medical attention should they occur. Patient verbalized understanding of these  instructions and education.  Patient Consent Given: Yes Education handout provided: Previously provided Muscles treated: R thoracic multifidi T4-T8, R cervical multifidi C3-C5 Electrical stimulation performed: Yes Parameters:  CPS Level 20 x 8 min adjusting intensity PRN Treatment response/outcome: twitch response and release of tension Modalities: Cervical Mechanical Traction -  10 minutes Max 30 lbs, min 25lbs 6 steps at 10 sec each up and down at 15 degrees  cervical flexion.      PATIENT EDUCATION:  Education details: evaluation findings, POC, goals, HEP with proper form Person educated: Patient Education method: Explanation, Verbal cues, and Handouts Education comprehension: verbalized understanding     HOME EXERCISE PROGRAM: Access Code: [email or web address] URL: [email or web address] Date: 11/27/2021 Prepared by: Starr Lake   Exercises - Seated Upper Trapezius Stretch  - 1 x daily - 7 x weekly - 3 sets - 2 reps - 30 seconds hold - Gentle Levator Scapulae Stretch  - 1 x daily - 7 x weekly - 2 sets - 2 reps - 30 seconds hold - Standing Cervical Retraction  - 1 x daily - 7 x weekly - 2 sets - 10 reps - Supine Chin Tuck with Towel  - 1 x daily - 7 x weekly - 2 sets - 10 reps - 5 hold - Seated Scapular Retraction  - 1 x daily - 7 x weekly - 2 sets - 10 reps - 5 seconds hold   ASSESSMENT:   CLINICAL IMPRESSION: Pt arrives to session with reporting elevated pain with increaed in RUE referred symptoms after doing some overhead work at home. Special testing continues to be consistent with Cervical radiculopathy. He responds well to dry needling with relief of pain / tension in combination with STW utilizing IASTM and thoracic mobilizations. Continued utilizing mechanical traction with noted relief of pain. He increased his FOTO score today to 45% however continues to report limited carry over of pain relief noting continued pain and would benefit from further diagnostic  testing, and potentially following up with a neurologist / spine MD.      OBJECTIVE IMPAIRMENTS decreased activity tolerance, decreased endurance, decreased ROM, decreased strength, increased fascial restrictions, increased muscle spasms, improper body mechanics, postural dysfunction, and pain.    ACTIVITY LIMITATIONS occupation.    PERSONAL FACTORS Age and 1 comorbidity: time since onset with mulitple injuries  are also affecting patient's functional outcome.      REHAB POTENTIAL: Good   CLINICAL DECISION MAKING: Evolving/moderate complexity   EVALUATION COMPLEXITY: Moderate     GOALS: Goals reviewed with patient? Yes   SHORT TERM GOALS: Target date: 12/18/2021   Pt to be IND with initial HEP to assist with therapeutic progression Baseline:  Goal status: MET 12/04/21   2.  Pt to verbalize and demo efficient posture and lifting mechanics to reduce and prevent neck/ RUE pain Baseline:  Status 12/17/21- pt wife reports he still slouches alot Goal status: ONGOING   LONG TERM GOALS: Target date: 01/08/2022   Pt to increase R cervical rotation and Sidebending by >/= 10 degrees and maintain all other cervical ROM with </= 4/10 max pain to assist with work related activities and safety with driving  Baseline:  Goal status: INITIAL   2.  Increase gross shoulder strength to >/= 4+/5 to assist with functional and efficient lifting mechancis and neck/ shoulder stability  Baseline:  Goal status: INITIAL   3.  Increase FOTO score to >/= 56% to demo improvement  in function Baseline:  Goal status: INITIAL   4.  Pt to be report no RUE N/T symptoms for >/= 2 weeks to demo improvement in function Baseline:  Goal status: INITIAL   5.  Pt to be able to return to work and resume regular activity per pt's personal goals.  Baseline:  Goal status: INITIAL   6.  Pt to be IND with all HEP to maintain and progress current LOF IND Baseline:  Goal status: INITIAL     PLAN: PT FREQUENCY:  1-2x/week   PT DURATION: 6 weeks   PLANNED INTERVENTIONS: Therapeutic exercises, Therapeutic activity, Neuromuscular re-education, Balance training, Gait training, Patient/Family education, Joint manipulation, Joint mobilization, Dry Needling, Spinal manipulation, Spinal mobilization, Cryotherapy, Moist heat, Taping, Traction, and Manual therapy   PLAN FOR NEXT SESSION: Review/ update HEP PRN. Posture education, posterior shoulder strength, STW for sub-occipitals / upper trap and levator on R,  mechanical cervical traction, TPDN .  Review sleeping positioning.    Victorio Creeden PT, DPT, LAT, ATC  12/19/21  12:42 PM

## 2021-12-25 ENCOUNTER — Encounter: Payer: BC Managed Care – PPO | Admitting: Physical Therapy

## 2021-12-26 ENCOUNTER — Encounter: Payer: Self-pay | Admitting: Physical Therapy

## 2021-12-26 ENCOUNTER — Ambulatory Visit: Payer: BC Managed Care – PPO | Admitting: Physical Therapy

## 2021-12-26 DIAGNOSIS — M542 Cervicalgia: Secondary | ICD-10-CM | POA: Diagnosis not present

## 2021-12-26 DIAGNOSIS — M5413 Radiculopathy, cervicothoracic region: Secondary | ICD-10-CM

## 2021-12-26 NOTE — Therapy (Signed)
OUTPATIENT PHYSICAL THERAPY TREATMENT NOTE   Patient Name: Austin Singleton MRN: [id number] DOB:April 06, 1961, 61 y.o., male Today's Date: 12/26/2021   PCP: No primary care provider on file.   REFERRING PROVIDER: Trenda Moots., MD  END OF SESSION:   PT End of Session - 12/26/21 1016     Visit Number 7    Number of Visits 13    Date for PT Re-Evaluation 01/22/22    PT Start Time 1016    PT Stop Time 1057    PT Time Calculation (min) 41 min    Activity Tolerance Patient tolerated treatment well;Patient limited by pain    Behavior During Therapy [facility] for tasks assessed/performed                 History reviewed. No pertinent past medical history. History reviewed. No pertinent surgical history. There are no problems to display for this patient.   REFERRING DIAG: Radiculopathy, cervical region [M54.12]  THERAPY DIAG:  Cervicalgia  Radiculopathy, cervicothoracic region  PERTINENT HISTORY: Anxiety   PRECAUTIONS: None SUBJECTIVE: " We saw the MD and they are planning to have to surgery on the neck some time after June 7th."  PAIN:  Are you having pain? Yes: NPRS scale: 7/10 Pain location: R side of th eneck, deltoid, and bicep Pain description: burning Aggravating factors: getting up in the morning, laying down, doing activity, raising hands over head Relieving factors: Taking meds, stretch    OBJECTIVE: (objective measures completed at initial evaluation unless otherwise dated)   OBJECTIVE:    DIAGNOSTIC FINDINGS:  N/A   PATIENT SURVEYS:  FOTO 37%, predicted 56% 12/19/2021  45%      COGNITION: Overall cognitive status: Within functional limits for tasks assessed     SENSATION: WFL   POSTURE:  Forwared head posture and keeps head lateral leaned to the L   PALPATION: TTP along the R upper trap/ levator scapuale and cerivcal parapsinals with multiple trigger points noted .     CERVICAL ROM:    Active ROM A/PROM (deg) 11/27/2021  12/26/2021   Flexion 30 P! 50 P!  Extension 30 P! 42P!  Right lateral flexion 20 P! 60  Left lateral flexion 40 60  Right rotation 55 P! 69 P!  Left rotation 70 69   (Blank rows = not tested)   UE ROM:   Active ROM Right 11/27/2021 Left 11/27/2021  Shoulder flexion [facility]  Shoulder extension [facility]  Shoulder abduction [facility]  Shoulder adduction      Shoulder extension [facility]  Shoulder internal rotation [facility]  Shoulder external rotation [facility]  Elbow flexion      Elbow extension      Wrist flexion      Wrist extension      Wrist ulnar deviation      Wrist radial deviation      Wrist pronation      Wrist supination       (Blank rows = not tested)   UE MMT:   MMT Right 11/27/2021 Left 11/27/2021 Right 12/26/2021 Left 12/26/2021  Shoulder flexion 4/5 4/5 4+/5 4+/5  Shoulder extension        Shoulder abduction 4/5 P! 4/5 4+/5 P! 4+/5  Shoulder adduction        Shoulder extension 4/5 4/5 5/5 5/5  Shoulder internal rotation 4-/5 4+/5 4+/5P 4+/5  Shoulder external rotation 4/5 4/5 4+/5 P! 4+/5  Middle trapezius        Lower trapezius  Elbow flexion        Elbow extension        Wrist flexion        Wrist extension        Wrist ulnar deviation        Wrist radial deviation        Wrist pronation        Wrist supination        Grip strength         (Blank rows = not tested)   CERVICAL SPECIAL TESTS:  Upper limb tension test (ULTT): Negative, Spurling's test: Positive, and Distraction test: Positive           TODAY'S TREATMENT:  [facility] Adult PT Treatment:                                                DATE: 12/26/2021 Therapeutic Exercise: Scapular retraction 1 x 10 with GTB Horizontal abduction 1 x 10 with GTB  Updated and Reviewed HEP  Therapeutic Activity: Reviewed ROM and strength improvement compared to initial measures. Reviewed anatomy of neck and discussed motions that impact the area's involved postural education that can and reduce referred symptoms.      [place] Adult PT Treatment:                                                DATE: 12/19/2021 Therapeutic Exercise: Levator scapoulae Rhomboid stretch    Manual Therapy: Bil first rib inferior mobs grade IV T1-T9 PA grade IV with multiple cavitation noted IASTM along the R upper trap/ levator scapulae  Trigger Point Dry-Needling  Treatment instructions: Expect mild to moderate muscle soreness. S/S of pneumothorax if dry needled over a lung field, and to seek immediate medical attention should they occur. Patient verbalized understanding of these instructions and education.  Patient Consent Given: Yes Education handout provided: Previously provided Muscles treated: R thoracic multifidi T4-T8, R cervical multifidi C3-C5 Electrical stimulation performed: Yes Parameters: CPS Level 20 x 10 min adjusting intensity PRN Treatment response/outcome: twitch response and release of tension Modalities: Cervical Mechanical Traction -  10 minutes Max 30 lbs, min 25lbs 6 steps at 10 sec each up and down at 15 degrees  cervical flexion. Self Care: Reviewed sleeping position to reduce tension on the neck keeping his head inline with his shoulders. To utilize chin tuck when performing shoulder and overhead activities to help take some tension of the neck.    [facility] Adult PT Treatment:                                                DATE: 12/17/21 Therapeutic Exercise: UBE level 3 x 3 min each way Head lift ( with cues on proper form) 10 sec x 6  Supine Green band horizontals and diagonals x 15 Supine green band ER bilat x 15  Levator and upper trap stretches   Cervical Mechanical Traction -  10 minutes Max 30 lbs, min 25lbs 6 steps at 5 sec each up and down at 15 degrees  cervical flexion. Self Care: Tennis ball verses theracane  for self TPR    PATIENT EDUCATION:  Education details: evaluation findings, POC, goals, HEP with proper form Person educated: Patient Education method: Explanation, Verbal  cues, and Handouts Education comprehension: verbalized understanding     HOME EXERCISE PROGRAM: Access Code: [email or web address] URL: [email or web address] Date: 11/27/2021 Prepared by: Starr Lake   Exercises - Seated Upper Trapezius Stretch  - 1 x daily - 7 x weekly - 3 sets - 2 reps - 30 seconds hold - Gentle Levator Scapulae Stretch  - 1 x daily - 7 x weekly - 2 sets - 2 reps - 30 seconds hold - Standing Cervical Retraction  - 1 x daily - 7 x weekly - 2 sets - 10 reps - Supine Chin Tuck with Towel  - 1 x daily - 7 x weekly - 2 sets - 10 reps - 5 hold - Seated Scapular Retraction  - 1 x daily - 7 x weekly - 2 sets - 10 reps - 5 seconds hold   ASSESSMENT:   CLINICAL IMPRESSION:   Pt has made progress with cervical ROM and bil shoulder strength. He continues to report pain in the neck with referred symptoms to the R shoulder. He spoke with his MD and reports the plan is to undergo a cervical fusion in the next few weeks. Reviewed his goals which he met or partially met all goals except for LTG #4 & #5. Extensively reviewed HEP and discussed importance of consistency. Due to continued pain and maximize benefit from PT in combination with plan for moving forward with surgery plan to formerly discharge pt from PT today.      OBJECTIVE IMPAIRMENTS decreased activity tolerance, decreased endurance, decreased ROM, decreased strength, increased fascial restrictions, increased muscle spasms, improper body mechanics, postural dysfunction, and pain.    ACTIVITY LIMITATIONS occupation.    PERSONAL FACTORS Age and 1 comorbidity: time since onset with mulitple injuries  are also affecting patient's functional outcome.      REHAB POTENTIAL: Good   CLINICAL DECISION MAKING: Evolving/moderate complexity   EVALUATION COMPLEXITY: Moderate     GOALS: Goals reviewed with patient? Yes   SHORT TERM GOALS: Target date: 12/18/2021   Pt to be IND with initial HEP to assist with therapeutic  progression Baseline:  Goal status: MET 12/04/21   2.  Pt to verbalize and demo efficient posture and lifting mechanics to reduce and prevent neck/ RUE pain Baseline:  Status 12/17/21- pt wife reports he still slouches alot Goal status: Partially met 12/26/2021   LONG TERM GOALS: Target date: 01/08/2022   Pt to increase R cervical rotation and Sidebending by >/= 10 degrees and maintain all other cervical ROM with </= 4/10 max pain to assist with work related activities and safety with driving  Baseline:  STATUS: pain at 6/10 Goal status: Partially met 12/26/2021   2.  Increase gross shoulder strength to >/= 4+/5 to assist with functional and efficient lifting mechancis and neck/ shoulder stability  Baseline:  Goal status: MET 12/26/2021   3.  Increase FOTO score to >/= 56% to demo improvement in function Baseline:  Goal status: Partially met 12/26/2021   4.  Pt to be report no RUE N/T symptoms for >/= 2 weeks to demo improvement in function Baseline:  Goal status: NOT MET 12/26/2021   5.  Pt to be able to return to work and resume regular activity per pt's personal goals.  Baseline:  Goal status: NOT MET 12/26/2021   6.  Pt to be IND with  all HEP to maintain and progress current LOF IND Baseline:  Goal status: MET 12/26/2021     PLAN: PT FREQUENCY: 1-2x/week   PT DURATION: 6 weeks   PLANNED INTERVENTIONS: Therapeutic exercises, Therapeutic activity, Neuromuscular re-education, Balance training, Gait training, Patient/Family education, Joint manipulation, Joint mobilization, Dry Needling, Spinal manipulation, Spinal mobilization, Cryotherapy, Moist heat, Taping, Traction, and Manual therapy   PLAN FOR NEXT SESSION: Review/ update HEP PRN. Posture education, posterior shoulder strength, STW for sub-occipitals / upper trap and levator on R,  mechanical cervical traction, TPDN .  Review sleeping positioning.    Mavrik Bynum PT, DPT, LAT, ATC  12/26/21  10:58  AM       PHYSICAL THERAPY DISCHARGE SUMMARY  Visits from Start of Care: 7  Current functional level related to goals / functional outcomes: See goals, FOTO 45%   Remaining deficits: See assessment   Education / Equipment: HEP, theraband, posture, lifting mechanics, anatomy of area involved.    Patient agrees to discharge. Patient goals were partially met. Patient is being discharged due to lack of progress.    Firman Petrow PT, DPT, LAT, ATC  12/26/21  10:58 AM

## 2021-12-27 ENCOUNTER — Encounter: Payer: BC Managed Care – PPO | Admitting: Physical Therapy

## 2021-12-31 ENCOUNTER — Ambulatory Visit: Payer: BC Managed Care – PPO | Admitting: Physical Therapy

## 2022-01-02 ENCOUNTER — Ambulatory Visit: Payer: BC Managed Care – PPO | Admitting: Physical Therapy

## 2022-01-08 DIAGNOSIS — M4802 Spinal stenosis, cervical region: Secondary | ICD-10-CM | POA: Insufficient documentation

## 2022-10-02 ENCOUNTER — Ambulatory Visit: Payer: BC Managed Care – PPO | Admitting: Family Medicine

## 2022-12-05 ENCOUNTER — Encounter: Payer: Self-pay | Admitting: Family Medicine

## 2022-12-05 ENCOUNTER — Ambulatory Visit: Payer: 59 | Admitting: Family Medicine

## 2022-12-05 VITALS — BP 144/96 | HR 85 | Temp 97.8°F | Ht 67.0 in | Wt 197.0 lb

## 2022-12-05 DIAGNOSIS — M5412 Radiculopathy, cervical region: Secondary | ICD-10-CM

## 2022-12-05 DIAGNOSIS — K219 Gastro-esophageal reflux disease without esophagitis: Secondary | ICD-10-CM | POA: Diagnosis not present

## 2022-12-05 DIAGNOSIS — Z7689 Persons encountering health services in other specified circumstances: Secondary | ICD-10-CM | POA: Diagnosis not present

## 2022-12-05 DIAGNOSIS — M4802 Spinal stenosis, cervical region: Secondary | ICD-10-CM

## 2022-12-05 DIAGNOSIS — E785 Hyperlipidemia, unspecified: Secondary | ICD-10-CM

## 2022-12-05 DIAGNOSIS — R030 Elevated blood-pressure reading, without diagnosis of hypertension: Secondary | ICD-10-CM

## 2022-12-05 DIAGNOSIS — R42 Dizziness and giddiness: Secondary | ICD-10-CM | POA: Diagnosis not present

## 2022-12-05 DIAGNOSIS — F32A Depression, unspecified: Secondary | ICD-10-CM | POA: Diagnosis not present

## 2022-12-05 MED ORDER — MECLIZINE HCL 25 MG PO TABS: 25.0000 mg | ORAL_TABLET | Freq: Two times a day (BID) | ORAL | 0 refills | Status: AC | PRN

## 2022-12-05 NOTE — Progress Notes (Unsigned)
New Patient Office Visit  Subjective    Patient ID: Austin Singleton, male    DOB: 1961/06/08  Age: 62 y.o. MRN: [id number]  CC:  Chief Complaint  Patient presents with   Establish Care    Damage to right shoulder, neck and back from company. Most of the time it makes his arm asleep. Has been going on for years. Gets dizzy laying down still sometimes    HPI Austin Singleton presents to establish care  Right cervical pain. Right arm numbness.   Taking gabapentin  Baclofen too strong   Taking Lexapro 10 mg daily. His parents passed away. Sister passed.      High cholesterol - Vytorin   Denies hx of HTN  GERD- takes omeprazole   Family hx of DM   Outpatient Encounter Medications as of 12/05/2022  Medication Sig   Ascorbic Acid (VITAMIN C PO) Take by mouth.   Cyanocobalamin (B-12 PO) Take by mouth.   escitalopram (LEXAPRO) 10 MG tablet Take 1 tablet by mouth daily.   ezetimibe-simvastatin (VYTORIN) 10-10 MG tablet Take 1 tablet by mouth daily.   fenofibrate (TRICOR) 48 MG tablet Take by mouth.   Fexofenadine-Pseudoephedrine (ALLEGRA-D 12 HOUR PO) Take by mouth.   fluticasone (FLONASE) 50 MCG/ACT nasal spray SPRAY TWICE A DAY AS NEEDED   gabapentin (NEURONTIN) 300 MG capsule Take 300 mg by mouth daily. 1 x or 2 x day as needed   meclizine (ANTIVERT) 25 MG tablet Take 1 tablet (25 mg total) by mouth 2 (two) times daily as needed for dizziness.   Misc Natural Products (URINOZINC PO) Take by mouth.   Omega-3 Fatty Acids (OMEGA 3 PO) Take by mouth.   omeprazole (PRILOSEC) 20 MG capsule Take 1 capsule by mouth daily.   VITAMIN D PO Take by mouth.   [DISCONTINUED] baclofen (LIORESAL) 10 MG tablet Take 1 tablet (10 mg total) by mouth 3 (three) times daily as needed for muscle spasms.   [DISCONTINUED] diclofenac (VOLTAREN) 75 MG EC tablet Take 1 tablet (75 mg total) by mouth 2 (two) times daily.   [DISCONTINUED] meloxicam (MOBIC) 15 MG tablet Take 1 tablet by mouth daily.    [DISCONTINUED] predniSONE (STERAPRED UNI-PAK 21 TAB) 10 MG (21) TBPK tablet Use as directed (Patient not taking: Reported on 11/27/2021)   No facility-administered encounter medications on file as of 12/05/2022.    History reviewed. No pertinent past medical history.  History reviewed. No pertinent surgical history.  History reviewed. No pertinent family history.  Social History   Socioeconomic History   Marital status: Married    Spouse name: Not on file   Number of children: Not on file   Years of education: Not on file   Highest education level: Not on file  Occupational History   Not on file  Tobacco Use   Smoking status: Not on file   Smokeless tobacco: Not on file  Substance and Sexual Activity   Alcohol use: Not on file   Drug use: Not on file   Sexual activity: Not on file  Other Topics Concern   Not on file  Social History Narrative   Not on file   Social Determinants of Health   Financial Resource Strain: Not on file  Food Insecurity: Not on file  Transportation Needs: Not on file  Physical Activity: Not on file  Stress: Not on file  Social Connections: Not on file  Intimate Partner Violence: Not on file    ROS  Objective    BP (!) 144/96 (BP Location: Left Arm, Patient Position: Sitting, Cuff Size: Large)   Pulse 85   Temp 97.8 F (36.6 C) (Temporal)   Ht 5\' 7"  (1.702 m)   Wt 197 lb (89.4 kg)   SpO2 96%   BMI 30.85 kg/m   Physical Exam  {Labs (Optional):23779}    Assessment & Plan:   Problem List Items Addressed This Visit       Digestive   Gastroesophageal reflux disease   Relevant Medications   omeprazole (PRILOSEC) 20 MG capsule   meclizine (ANTIVERT) 25 MG tablet     Nervous and Auditory   Cervical radiculopathy   Relevant Medications   escitalopram (LEXAPRO) 10 MG tablet   Other Relevant Orders   Ambulatory referral to Orthopedic Surgery     Other   Cervical stenosis of spinal canal   Relevant Orders   Ambulatory  referral to Orthopedic Surgery   Depression   Relevant Medications   escitalopram (LEXAPRO) 10 MG tablet   Dizziness   Relevant Medications   meclizine (ANTIVERT) 25 MG tablet   Elevated blood pressure reading in office without diagnosis of hypertension   Hyperlipidemia - Primary   Relevant Medications   ezetimibe-simvastatin (VYTORIN) 10-10 MG tablet   fenofibrate (TRICOR) 48 MG tablet   Other Visit Diagnoses     Encounter to establish care           Return in about 4 weeks (around 01/02/2023) for fasting chronic health visit .   Hetty Blend, NP-C

## 2022-12-05 NOTE — Patient Instructions (Signed)
Thank you for trusting [place] with your health care.  Please check your blood pressure outside of here.  You can get a blood pressure cuff for your upper arm at a pharmacy such as [facility], [facility], [facility], etc.  Eat a low-sodium diet.  I have referred you to an orthopedist and they will call you to schedule a visit for your neck and arm pain.  Continue gabapentin   Come back fasting in 4 weeks so we can check your cholesterol. (Nothing to eat or drink except water for at least 6 hours)

## 2022-12-12 ENCOUNTER — Other Ambulatory Visit (INDEPENDENT_AMBULATORY_CARE_PROVIDER_SITE_OTHER): Payer: BC Managed Care – PPO

## 2022-12-12 ENCOUNTER — Ambulatory Visit (INDEPENDENT_AMBULATORY_CARE_PROVIDER_SITE_OTHER): Payer: 59 | Admitting: Orthopedic Surgery

## 2022-12-12 VITALS — BP 157/108 | HR 96 | Ht 67.0 in | Wt 197.0 lb

## 2022-12-12 DIAGNOSIS — M5412 Radiculopathy, cervical region: Secondary | ICD-10-CM

## 2022-12-12 MED ORDER — PREGABALIN 75 MG PO CAPS: 75.0000 mg | ORAL_CAPSULE | Freq: Two times a day (BID) | ORAL | 0 refills | Status: DC

## 2022-12-12 NOTE — Progress Notes (Signed)
Orthopedic Spine Surgery Office Note  Assessment: Patient is a 62 y.o. male with neck pain that radiates into the right shoulder, possible radiculopathy   Plan: -Explained that initially conservative treatment is tried as a significant number of patients may experience relief with these treatment modalities. Discussed that the conservative treatments include:  -activity modification  -physical therapy  -over the counter pain medications  -medrol dosepak  -cervical steroid injections -Patient has tried PT, gabapentin -Gabapentin helps him somewhat but makes him drowsy so recommended trial of Lyrica which was prescribed him today. -Patient has tried conservative treatment for over six weeks now, so recommended MRI of the cervical spine to evaluate for radiculopathy  -Patient should return to office in 4 weeks, x-rays at next visit: none   Patient expressed understanding of the plan and all questions were answered to the patient's satisfaction.   ___________________________________________________________________________   History:  Patient is a 62 y.o. male who presents today for cervical spine.  Patient has had several years of neck pain that radiates into his right shoulder.  He states he for started experiencing this at work and it got progressively worse with time.  He is no longer able to work because of the pain.  Pain does not radiate past the shoulder.  He does note numbness that starts on the right side of the neck and extends down to the fingers on the right side.  He feels it in all the fingers on the right side.  This is not constant like the pain in his shoulder.  He feels this intermittently on a daily basis.  He does not have any symptoms on the left upper extremity.  He initially tried to get this injury covered through [employer] but was denied.  He eventually got a lawyer but was unsuccessful in his appeal.   Weakness: Generally feels weak but no specific  motor weakness Difficulty with fine motor skills (e.g., buttoning shirts, handwriting): Denies Symptoms of imbalance: Yes, when experiencing vertigo.  No other unsteadiness or imbalance noted Paresthesias and numbness: Yes, has decreased sensation in the lateral aspect of the arm, dorsal forearm, and all the fingers.  It is periodic in nature.  No other numbness or paresthesias Bowel or bladder incontinence: Denies Saddle anesthesia: Denies  Treatments tried: PT, gabapentin  Review of systems: Denies fevers and chills, night sweats, unexplained weight loss, history of cancer.  Has had pain that wakes him at night  Past medical history: HLD GERD Depression/anxiety Vertigo  Allergies: NKDA  Past surgical history:  Trigger finger release  Social history: Denies use of nicotine product (smoking, vaping, patches, smokeless) Alcohol use: Yes, less than 1 drink per week Denies recreational drug use   Physical Exam:  BMI of 30.9  General: no acute distress, appears stated age Neurologic: alert, answering questions appropriately, following commands Respiratory: unlabored breathing on room air, symmetric chest rise Psychiatric: appropriate affect, normal cadence to speech   MSK (spine):  -Strength exam      Left  Right Grip strength                5/5  5/5 Interosseus   5/5   5/5 Wrist extension  5/5  5/5 Wrist flexion   5/5  5/5 Elbow flexion   5/5  5/5 Deltoid    5/5  5/5  EHL    5/5  5/5 TA    5/5  5/5 GSC    5/5  5/5 Knee extension  5/5  5/5 Hip  flexion   5/5  5/5  -Sensory exam    Sensation intact to light touch in L3-S1 nerve distributions of bilateral lower extremities  Sensation intact to light touch in C5-T1 nerve distributions of bilateral upper extremities  -Brachioradialis DTR: 2/4 on the left, 2/4 on the right -Biceps DTR: 2/4 on the left, 2/4 on the right -Achilles DTR: 2/4 on the left, 2/4 on the right -Patellar tendon DTR: 2/4 on the left, 2/4 on  the right  -Spurling: Positive on the right, negative on the left -Hoffman sign: Negative bilaterally -Clonus: No beats bilaterally -Interosseous wasting: None seen -Grip and release test: Negative -Romberg: Negative -Gait: Normal -Imbalance with tandem gait: No  Left shoulder exam: No pain through range of motion Right shoulder exam: No pain through range of motion, negative Jobe, negative belly press, no weakness with external rotation with arm at side  Tinel's at wrist: Negative bilaterally Phalen's at wrist: Negative bilaterally Durkan's: Negative bilaterally  Tinel's at elbow: Negative bilaterally  Imaging: XR of the cervical spine from 12/12/2022 was independently reviewed and interpreted, showing disc height loss with anterior osteophyte formation at C5/6 and C6/7. No fracture or dislocation seen. No evidence of instability on flexion/extension views. Neutral alignment.   Patient name: Austin Singleton Patient MRN: [id number] Date of visit: 12/12/22

## 2022-12-27 ENCOUNTER — Ambulatory Visit
Admission: RE | Admit: 2022-12-27 | Discharge: 2022-12-27 | Disposition: A | Discharge status: Home / Self Care | Payer: 59 | Source: Ambulatory Visit | Attending: Orthopedic Surgery | Admitting: Orthopedic Surgery

## 2022-12-27 DIAGNOSIS — M4712 Other spondylosis with myelopathy, cervical region: Secondary | ICD-10-CM | POA: Diagnosis not present

## 2022-12-27 DIAGNOSIS — M5412 Radiculopathy, cervical region: Secondary | ICD-10-CM

## 2023-01-02 ENCOUNTER — Other Ambulatory Visit: Payer: Self-pay | Admitting: Family Medicine

## 2023-01-02 ENCOUNTER — Encounter: Payer: Self-pay | Admitting: Family Medicine

## 2023-01-02 ENCOUNTER — Ambulatory Visit: Payer: 59 | Admitting: Family Medicine

## 2023-01-02 VITALS — BP 130/86 | HR 88 | Temp 97.8°F | Ht 67.0 in | Wt 197.0 lb

## 2023-01-02 DIAGNOSIS — E119 Type 2 diabetes mellitus without complications: Secondary | ICD-10-CM

## 2023-01-02 DIAGNOSIS — E669 Obesity, unspecified: Secondary | ICD-10-CM

## 2023-01-02 DIAGNOSIS — K219 Gastro-esophageal reflux disease without esophagitis: Secondary | ICD-10-CM | POA: Diagnosis not present

## 2023-01-02 DIAGNOSIS — Z6830 Body mass index (BMI) 30.0-30.9, adult: Secondary | ICD-10-CM | POA: Diagnosis not present

## 2023-01-02 DIAGNOSIS — R030 Elevated blood-pressure reading, without diagnosis of hypertension: Secondary | ICD-10-CM | POA: Diagnosis not present

## 2023-01-02 DIAGNOSIS — F32A Depression, unspecified: Secondary | ICD-10-CM | POA: Diagnosis not present

## 2023-01-02 DIAGNOSIS — E785 Hyperlipidemia, unspecified: Secondary | ICD-10-CM

## 2023-01-02 DIAGNOSIS — E66811 Obesity, class 1: Secondary | ICD-10-CM | POA: Insufficient documentation

## 2023-01-02 DIAGNOSIS — Z125 Encounter for screening for malignant neoplasm of prostate: Secondary | ICD-10-CM

## 2023-01-02 LAB — COMPREHENSIVE METABOLIC PANEL
ALT: 26 U/L (ref 0–53)
AST: 14 U/L (ref 0–37)
Albumin: 4.3 g/dL (ref 3.5–5.2)
Alkaline Phosphatase: 50 U/L (ref 39–117)
BUN: 20 mg/dL (ref 6–23)
CO2: 26 mEq/L (ref 19–32)
Calcium: 9.6 mg/dL (ref 8.4–10.5)
Chloride: 102 mEq/L (ref 96–112)
Creatinine, Ser: 1.05 mg/dL (ref 0.40–1.50)
GFR: 77.17 mL/min (ref >60.00)
Glucose, Bld: 117 mg/dL — ABNORMAL HIGH (ref 70–99)
Potassium: 4.3 mEq/L (ref 3.5–5.1)
Sodium: 136 mEq/L (ref 135–145)
Total Bilirubin: 0.5 mg/dL (ref 0.2–1.2)
Total Protein: 7.6 g/dL (ref 6.0–8.3)

## 2023-01-02 LAB — CBC WITH DIFFERENTIAL/PLATELET
Basophils Absolute: 0 10*3/uL (ref 0.0–0.1)
Basophils Relative: 0.4 % (ref 0.0–3.0)
Eosinophils Absolute: 0 10*3/uL (ref 0.0–0.7)
Eosinophils Relative: 0.8 % (ref 0.0–5.0)
HCT: 47.3 % (ref 39.0–52.0)
Hemoglobin: 16 g/dL (ref 13.0–17.0)
Lymphocytes Relative: 33.3 % (ref 12.0–46.0)
Lymphs Abs: 2 10*3/uL (ref 0.7–4.0)
MCHC: 33.7 g/dL (ref 30.0–36.0)
MCV: 86.5 fl (ref 78.0–100.0)
Monocytes Absolute: 0.4 10*3/uL (ref 0.1–1.0)
Monocytes Relative: 6.5 % (ref 3.0–12.0)
Neutro Abs: 3.5 10*3/uL (ref 1.4–7.7)
Neutrophils Relative %: 59 % (ref 43.0–77.0)
Platelets: 304 10*3/uL (ref 150.0–400.0)
RBC: 5.47 Mil/uL (ref 4.22–5.81)
RDW: 14.1 % (ref 11.5–15.5)
WBC: 6 10*3/uL (ref 4.0–10.5)

## 2023-01-02 LAB — LIPID PANEL
Cholesterol: 189 mg/dL (ref 0–200)
HDL: 48.1 mg/dL (ref >39.00)
LDL Cholesterol: 107 mg/dL — ABNORMAL HIGH (ref 0–99)
NonHDL: 141.14
Total CHOL/HDL Ratio: 4
Triglycerides: 173 mg/dL — ABNORMAL HIGH (ref 0.0–149.0)
VLDL: 34.6 mg/dL (ref 0.0–40.0)

## 2023-01-02 LAB — TSH: TSH: 2.73 u[IU]/mL (ref 0.35–5.50)

## 2023-01-02 LAB — HEMOGLOBIN A1C: Hgb A1c MFr Bld: 6.8 % — ABNORMAL HIGH (ref 4.6–6.5)

## 2023-01-02 LAB — PSA: PSA: 3.01 ng/mL (ref 0.10–4.00)

## 2023-01-02 MED ORDER — METFORMIN HCL 500 MG PO TABS: 500.0000 mg | ORAL_TABLET | Freq: Every day | ORAL | 0 refills | Status: DC

## 2023-01-02 MED ORDER — ESCITALOPRAM OXALATE 10 MG PO TABS
10.0000 mg | ORAL_TABLET | Freq: Every day | ORAL | 4 refills | Status: DC
  Filled 2023-03-06: qty 30, 30d supply, fill #0
  Filled 2023-05-26 – 2023-08-30 (×3): qty 30, 30d supply, fill #1
  Filled 2023-11-16: qty 30, 30d supply, fill #2

## 2023-01-02 NOTE — Progress Notes (Signed)
Subjective:     Patient ID: Austin Singleton, male    DOB: 29-Sep-1960, 62 y.o.   MRN: [id number]  Chief Complaint  Patient presents with   Medical Management of Chronic Issues    fasting    HPI Patient is in today for follow up for recently elevated BP without hx of HTN.  Also f/u on depression. States he has been more easily irritated recently. He was taking his Lexapro every other day so he would not run out of medication.  Hx of loss of several family members. Still working through grief.   HTN- BP at home 130s/70-80s at home.   Fasting lipids today. On treatment for HLD  Intermittent dizziness. Less often over the past week.    Health Maintenance Due  Topic Date Due   HIV Screening  Never done   Hepatitis C Screening  Never done   DTaP/Tdap/Td (1 - Tdap) Never done   Colonoscopy  Never done   Zoster Vaccines- Shingrix (1 of 2) Never done    History reviewed. No pertinent past medical history.  History reviewed. No pertinent surgical history.  History reviewed. No pertinent family history.  Social History   Socioeconomic History   Marital status: Married    Spouse name: Not on file   Number of children: Not on file   Years of education: Not on file   Highest education level: Not on file  Occupational History   Not on file  Tobacco Use   Smoking status: Not on file   Smokeless tobacco: Not on file  Substance and Sexual Activity   Alcohol use: Not on file   Drug use: Not on file   Sexual activity: Not on file  Other Topics Concern   Not on file  Social History Narrative   Not on file   Social Determinants of Health   Financial Resource Strain: Not on file  Food Insecurity: Not on file  Transportation Needs: Not on file  Physical Activity: Not on file  Stress: Not on file  Social Connections: Not on file  Intimate Partner Violence: Not on file    Outpatient Medications Prior to Visit  Medication Sig Dispense Refill   Ascorbic Acid (VITAMIN C  PO) Take by mouth.     Cyanocobalamin (B-12 PO) Take by mouth.     ezetimibe-simvastatin (VYTORIN) 10-10 MG tablet Take 1 tablet by mouth daily.     fenofibrate (TRICOR) 48 MG tablet Take by mouth.     Fexofenadine-Pseudoephedrine (ALLEGRA-D 12 HOUR PO) Take by mouth.     fluticasone (FLONASE) 50 MCG/ACT nasal spray SPRAY TWICE A DAY AS NEEDED     meclizine (ANTIVERT) 25 MG tablet Take 1 tablet (25 mg total) by mouth 2 (two) times daily as needed for dizziness. 30 tablet 0   Misc Natural Products (URINOZINC PO) Take by mouth.     Omega-3 Fatty Acids (OMEGA 3 PO) Take by mouth.     omeprazole (PRILOSEC) 20 MG capsule Take 1 capsule by mouth daily.     pregabalin (LYRICA) 75 MG capsule Take 1 capsule (75 mg total) by mouth 2 (two) times daily. 60 capsule 0   VITAMIN D PO Take by mouth.     escitalopram (LEXAPRO) 10 MG tablet Take 1 tablet by mouth daily.     No facility-administered medications prior to visit.    Not on File  Review of Systems  Constitutional:  Negative for chills, fever and malaise/fatigue.  Respiratory:  Negative for shortness  of breath.   Cardiovascular:  Negative for chest pain, palpitations and leg swelling.  Gastrointestinal:  Negative for abdominal pain, constipation, diarrhea, nausea and vomiting.  Genitourinary:  Negative for dysuria, frequency and urgency.  Neurological:  Positive for dizziness. Negative for sensory change, focal weakness and headaches.  Psychiatric/Behavioral:  Positive for depression. Negative for suicidal ideas. The patient is not nervous/anxious.        Objective:    Physical Exam Constitutional:      General: He is not in acute distress.    Appearance: He is not ill-appearing.  Eyes:     Extraocular Movements: Extraocular movements intact.     Conjunctiva/sclera: Conjunctivae normal.  Cardiovascular:     Rate and Rhythm: Normal rate.  Pulmonary:     Effort: Pulmonary effort is normal.  Musculoskeletal:     Cervical back:  Normal range of motion and neck supple.     Right lower leg: No edema.     Left lower leg: No edema.  Skin:    General: Skin is warm and dry.  Neurological:     General: No focal deficit present.     Mental Status: He is alert and oriented to person, place, and time.  Psychiatric:        Mood and Affect: Mood normal.        Behavior: Behavior normal.        Thought Content: Thought content normal.     BP 130/86 (BP Location: Left Arm, Patient Position: Sitting, Cuff Size: Large)   Pulse 88   Temp 97.8 F (36.6 C) (Temporal)   Ht 5\' 7"  (1.702 m)   Wt 197 lb (89.4 kg)   SpO2 96%   BMI 30.85 kg/m  Wt Readings from Last 3 Encounters:  01/02/23 197 lb (89.4 kg)  12/12/22 197 lb (89.4 kg)  12/05/22 197 lb (89.4 kg)       Assessment & Plan:   Problem List Items Addressed This Visit       Digestive   Gastroesophageal reflux disease    Continue avoiding triggers and good lifestyle management.  Continue PPI        Other   Depression    Refilled Lexapro.  Strongly encourage that he schedule with a therapist.  A list was provided.  No SI.      Relevant Medications   escitalopram (LEXAPRO) 10 MG tablet   Elevated blood pressure reading in office without diagnosis of hypertension    Blood pressure improved.  Blood pressures at home are mostly in goal range      Hyperlipidemia - Primary    Currently taking Vytorin and fenofibrate.  Check fasting lipids and follow-up      Relevant Orders   Comprehensive metabolic panel   Lipid panel   Obesity (BMI 30.0-34.9)    Encourage low-fat, low carbohydrate diet and being active for weight loss.  Check labs to look for complications related to obesity.      Relevant Orders   CBC with Differential/Platelet   Comprehensive metabolic panel   TSH   Hemoglobin A1c   Screening for prostate cancer    Done per guidelines.  Overdue.      Relevant Orders   PSA    I have changed Rulon Eisenmenger Silverstein's escitalopram. I am also having  him maintain his omeprazole, ezetimibe-simvastatin, fenofibrate, fluticasone, Fexofenadine-Pseudoephedrine (ALLEGRA-D 12 HOUR PO), Cyanocobalamin (B-12 PO), VITAMIN D PO, Misc Natural Products (URINOZINC PO), Ascorbic Acid (VITAMIN C PO), Omega-3 Fatty Acids (OMEGA  3 PO), meclizine, and pregabalin.  Meds ordered this encounter  Medications   escitalopram (LEXAPRO) 10 MG tablet    Sig: Take 1 tablet (10 mg total) by mouth daily.    Dispense:  90 tablet    Refill:  1    Order Specific Question:   Supervising Provider    Answer:   Hillard Danker A [[id number]]

## 2023-01-02 NOTE — Assessment & Plan Note (Signed)
Refilled Lexapro.  Strongly encourage that he schedule with a therapist.  A list was provided.  No SI.

## 2023-01-02 NOTE — Progress Notes (Signed)
Please call and let patient and his wife know that his blood test to screen for diabetes shows that he does have diabetes.  His A1c is 6.8% I will send in a medication called metformin for him to start taking daily with breakfast.  I would like for him to follow-up in 2 to 4 weeks to discuss diabetes since this is a new condition.

## 2023-01-02 NOTE — Assessment & Plan Note (Addendum)
Continue avoiding triggers and good lifestyle management.  Continue PPI

## 2023-01-02 NOTE — Assessment & Plan Note (Signed)
Encourage low-fat, low carbohydrate diet and being active for weight loss.  Check labs to look for complications related to obesity.

## 2023-01-02 NOTE — Assessment & Plan Note (Signed)
Currently taking Vytorin and fenofibrate.  Check fasting lipids and follow-up

## 2023-01-02 NOTE — Assessment & Plan Note (Signed)
Done per guidelines.  Overdue.

## 2023-01-02 NOTE — Patient Instructions (Addendum)
Your blood pressure is better.  Please continue to monitor your blood pressure at home.  If you see readings higher than 130/80 consistently, let me know.  Continue your current medications.  We will be in touch with your lab results.   Please call and schedule with a therapist/counselor  [email or web address]  [email or web address]  [facility]     DASH Eating Plan DASH stands for Dietary Approaches to Stop Hypertension. The DASH eating plan is a healthy eating plan that has been shown to: Lower high blood pressure (hypertension). Reduce your risk for type 2 diabetes, heart disease, and stroke. Help with weight loss. What are tips for following this plan? Reading food labels Check food labels for the amount of salt (sodium) per serving. Choose foods with less than 5 percent of the Daily Value (DV) of sodium. In general, foods with less than 300 milligrams (mg) of sodium per serving fit into this eating plan. To find whole grains, look for the word "whole" as the first word in the ingredient list. Shopping Buy products labeled as "low-sodium" or "no salt added." Buy fresh foods. Avoid canned foods and pre-made or frozen meals. Cooking Try not to add salt when you cook. Use salt-free seasonings or herbs instead of table salt or sea salt. Check with your health care provider or pharmacist before using salt substitutes. Do not fry foods. Cook foods in healthy ways, such as baking, boiling, grilling, roasting, or broiling. Cook using oils that are good for your heart. These include olive, canola, avocado, soybean, and sunflower oil. Meal planning  Eat a balanced diet. This should include: 4 or more servings of fruits and 4 or more servings of vegetables each day. Try to fill half of your plate with fruits and vegetables. 6-8 servings of whole grains each day. 6 or less servings of lean meat, poultry, or fish each day. 1 oz is 1 serving. A 3 oz (85 g) serving of meat is about the  same size as the palm of your hand. One egg is 1 oz (28 g). 2-3 servings of low-fat dairy each day. One serving is 1 cup (237 mL). 1 serving of nuts, seeds, or beans 5 times each week. 2-3 servings of heart-healthy fats. Healthy fats called omega-3 fatty acids are found in foods such as walnuts, flaxseeds, fortified milks, and eggs. These fats are also found in cold-water fish, such as sardines, salmon, and mackerel. Limit how much you eat of: Canned or prepackaged foods. Food that is high in trans fat, such as fried foods. Food that is high in saturated fat, such as fatty meat. Desserts and other sweets, sugary drinks, and other foods with added sugar. Full-fat dairy products. Do not salt foods before eating. Do not eat more than 4 egg yolks a week. Try to eat at least 2 vegetarian meals a week. Eat more home-cooked food and less restaurant, buffet, and fast food. Lifestyle When eating at a restaurant, ask if your food can be made with less salt or no salt. If you drink alcohol: Limit how much you have to: 0-1 drink a day if you are male. 0-2 drinks a day if you are male. Know how much alcohol is in your drink. In the [place] one drink is one 12 oz bottle of beer (355 mL), one 5 oz glass of wine (148 mL), or one 1 oz glass of hard liquor (44 mL). General information Avoid eating more than 2,300 mg of salt a day. If you have  hypertension, you may need to reduce your sodium intake to 1,500 mg a day. Work with your provider to stay at a healthy body weight or lose weight. Ask what the best weight range is for you. On most days of the week, get at least 30 minutes of exercise that causes your heart to beat faster. This may include walking, swimming, or biking. Work with your provider or dietitian to adjust your eating plan to meet your specific calorie needs. What foods should I eat? Fruits All fresh, dried, or frozen fruit. Canned fruits that are in their natural juice and do not have  sugar added to them. Vegetables Fresh or frozen vegetables that are raw, steamed, roasted, or grilled. Low-sodium or reduced-sodium tomato and vegetable juice. Low-sodium or reduced-sodium tomato sauce and tomato paste. Low-sodium or reduced-sodium canned vegetables. Grains Whole-grain or whole-wheat bread. Whole-grain or whole-wheat pasta. Brown rice. Orpah Cobb. Bulgur. Whole-grain and low-sodium cereals. Pita bread. Low-fat, low-sodium crackers. Whole-wheat flour tortillas. Meats and other proteins Skinless chicken or [place]. Ground chicken or [place]. Pork with fat trimmed off. Fish and seafood. Egg whites. Dried beans, peas, or lentils. Unsalted nuts, nut butters, and seeds. Unsalted canned beans. Lean cuts of beef with fat trimmed off. Low-sodium, lean precooked or cured meat, such as sausages or meat loaves. Dairy Low-fat (1%) or fat-free (skim) milk. Reduced-fat, low-fat, or fat-free cheeses. Nonfat, low-sodium ricotta or cottage cheese. Low-fat or nonfat yogurt. Low-fat, low-sodium cheese. Fats and oils Soft margarine without trans fats. Vegetable oil. Reduced-fat, low-fat, or light mayonnaise and salad dressings (reduced-sodium). Canola, safflower, olive, avocado, soybean, and sunflower oils. Avocado. Seasonings and condiments Herbs. Spices. Seasoning mixes without salt. Other foods Unsalted popcorn and pretzels. Fat-free sweets. The items listed above may not be all the foods and drinks you can have. Talk to a dietitian to learn more. What foods should I avoid? Fruits Canned fruit in a light or heavy syrup. Fried fruit. Fruit in cream or butter sauce. Vegetables Creamed or fried vegetables. Vegetables in a cheese sauce. Regular canned vegetables that are not marked as low-sodium or reduced-sodium. Regular canned tomato sauce and paste that are not marked as low-sodium or reduced-sodium. Regular tomato and vegetable juices that are not marked as low-sodium or reduced-sodium.  Rosita Fire. Olives. Grains Baked goods made with fat, such as croissants, muffins, or some breads. Dry pasta or rice meal packs. Meats and other proteins Fatty cuts of meat. Ribs. Fried meat. Tomasa Blase. Bologna, salami, and other precooked or cured meats, such as sausages or meat loaves, that are not lean and low in sodium. Fat from the back of a pig (fatback). Bratwurst. Salted nuts and seeds. Canned beans with added salt. Canned or smoked fish. Whole eggs or egg yolks. Chicken or [place] with skin. Dairy Whole or 2% milk, cream, and half-and-half. Whole or full-fat cream cheese. Whole-fat or sweetened yogurt. Full-fat cheese. Nondairy creamers. Whipped toppings. Processed cheese and cheese spreads. Fats and oils Butter. Stick margarine. Lard. Shortening. Ghee. Bacon fat. Tropical oils, such as coconut, palm kernel, or palm oil. Seasonings and condiments Onion salt, garlic salt, seasoned salt, table salt, and sea salt. Worcestershire sauce. Tartar sauce. Barbecue sauce. Teriyaki sauce. Soy sauce, including reduced-sodium soy sauce. Steak sauce. Canned and packaged gravies. Fish sauce. Oyster sauce. Cocktail sauce. Store-bought horseradish. Ketchup. Mustard. Meat flavorings and tenderizers. Bouillon cubes. Hot sauces. Pre-made or packaged marinades. Pre-made or packaged taco seasonings. Relishes. Regular salad dressings. Other foods Salted popcorn and pretzels. The items listed above may not  be all the foods and drinks you should avoid. Talk to a dietitian to learn more. Where to find more information National Heart, Lung, and Blood Institute (NHLBI): BuffaloDryCleaner.gl American Heart Association (AHA): heart.org Academy of Nutrition and Dietetics: eatright.org National Kidney Foundation (NKF): kidney.org This information is not intended to replace advice given to you by your health care provider. Make sure you discuss any questions you have with your health care provider. Document Revised: 08/07/2022  Document Reviewed: 08/07/2022 Elsevier Patient Education  2024 [employer].

## 2023-01-02 NOTE — Assessment & Plan Note (Signed)
Blood pressure improved.  Blood pressures at home are mostly in goal range

## 2023-01-09 ENCOUNTER — Ambulatory Visit (INDEPENDENT_AMBULATORY_CARE_PROVIDER_SITE_OTHER): Payer: BC Managed Care – PPO | Admitting: Orthopedic Surgery

## 2023-01-09 DIAGNOSIS — M5412 Radiculopathy, cervical region: Secondary | ICD-10-CM | POA: Diagnosis not present

## 2023-01-09 NOTE — Progress Notes (Signed)
rthopedic Spine Surgery Office Note   Assessment: Patient is a 62 y.o. male with neck pain that radiates into the right shoulder and lateral arm.  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 Discussed that the conservative treatments include:             -activity modification             -physical therapy             -over the counter pain medications             -medrol dosepak             -cervical steroid injections -Patient has tried PT, gabapentin, Lyrica -Discussed epidural injection as another nonoperative treatment he could try.  Since he is doing well with the Lyrica, he wanted to hold off on the injection for -Patient should return to office on an as-needed basis     Patient expressed understanding of the plan and all questions were answered to the patient's satisfaction.    ___________________________________________________________________________     History:   Patient is a 62 y.o. male who presents today for routine follow-up on his cervical spine.  His pain is unchanged since the last time I saw him.  He does feel that the Lyrica I prescribed is helpful but can make him sleepy at times.  He feels the pain on the right side of his neck going into the right shoulder.  It will radiate along the lateral aspect of the arm to the level of the elbow.  It does not radiate past the elbow.  He sometimes gets numbness and paresthesias in his right hand and notes it improves if he shakes out the hand.  He does not have any pain rating into the left upper extremity.  He has not noticed any other numbness or paresthesias.  He has not had any issues with fine motor skills in the hand.  He does have vertigo but has not noticed any other unsteadiness with his gait and there have been no recent changes.     Treatments tried:  PT, gabapentin, Lyrica     Physical Exam:   BMI of 30.9   General: no acute distress, appears stated age Neurologic: alert, answering questions appropriately, following commands Respiratory: unlabored breathing on room air, symmetric chest rise Psychiatric: appropriate affect, normal cadence to speech     MSK (spine):   -Strength exam                                                   Left                  Right Grip strength                5/5                  5/5 Interosseus                  5/5                  5/5 Wrist extension            5/5  5/5 Wrist flexion                 5/5                  5/5 Elbow flexion                5/5                  5/5 Deltoid                          5/5                  5/5   EHL                              5/5                  5/5 TA                                 5/5                  5/5 GSC                             5/5                  5/5 Knee extension            5/5                  5/5 Hip flexion                    5/5                  5/5   -Sensory exam                           Sensation intact to light touch in L3-S1 nerve distributions of bilateral lower extremities             Sensation intact to light touch in C5-T1 nerve distributions of bilateral upper extremities   -Brachioradialis DTR: 2/4 on the left, 2/4 on the right -Biceps DTR: 2/4 on the left, 2/4 on the right -Achilles DTR: 2/4 on the left, 2/4 on the right -Patellar tendon DTR: 2/4 on the left, 2/4 on the right   -Spurling: Positive on the right, negative on the left -Hoffman sign: Negative bilaterally -Clonus: No beats bilaterally -Interosseous wasting: None seen -Grip and release test: Negative -Romberg: Negative -Gait: Normal -Imbalance with tandem gait: No   Left shoulder exam: No pain through range of motion Right shoulder exam: No pain through range of motion, negative Jobe, negative belly press, no weakness with external  rotation with arm at side   Tinel's at wrist: Negative bilaterally Phalen's at wrist: Negative bilaterally Durkan's: Negative bilaterally   Tinel's at elbow: Negative bilaterally   Imaging: XR of the cervical spine from 12/12/2022 was previously independently reviewed and interpreted, showing disc height loss with anterior osteophyte formation at C5/6 and C6/7. No fracture or dislocation seen. No evidence of instability on flexion/extension views. Neutral alignment.   MRI of the cervical spine from 12/27/2022 was independently  reviewed and interpreted, showing disc bulges at C5/6 and C6/7.  Foraminal stenosis on the right at C4/5.  Central and bilateral foraminal stenosis at C5/6.  Central and bilateral foraminal stenosis at C6/7.  No T2 cord signal change.   Patient name: Austin Singleton Patient MRN: [id number] Date of visit: 01/09/23

## 2023-01-17 ENCOUNTER — Other Ambulatory Visit: Payer: Self-pay | Admitting: Orthopedic Surgery

## 2023-01-30 ENCOUNTER — Ambulatory Visit: Payer: 59 | Admitting: Family Medicine

## 2023-02-04 ENCOUNTER — Ambulatory Visit: Payer: 59 | Admitting: Family Medicine

## 2023-02-04 ENCOUNTER — Encounter: Payer: Self-pay | Admitting: Family Medicine

## 2023-02-04 ENCOUNTER — Other Ambulatory Visit (HOSPITAL_COMMUNITY): Payer: Self-pay

## 2023-02-04 VITALS — BP 134/90 | HR 80 | Temp 97.6°F | Ht 67.0 in | Wt 195.0 lb

## 2023-02-04 DIAGNOSIS — E119 Type 2 diabetes mellitus without complications: Secondary | ICD-10-CM | POA: Insufficient documentation

## 2023-02-04 DIAGNOSIS — Z23 Encounter for immunization: Secondary | ICD-10-CM | POA: Diagnosis not present

## 2023-02-04 DIAGNOSIS — E785 Hyperlipidemia, unspecified: Secondary | ICD-10-CM

## 2023-02-04 DIAGNOSIS — Z1211 Encounter for screening for malignant neoplasm of colon: Secondary | ICD-10-CM

## 2023-02-04 DIAGNOSIS — Z7984 Long term (current) use of oral hypoglycemic drugs: Secondary | ICD-10-CM | POA: Diagnosis not present

## 2023-02-04 DIAGNOSIS — F32A Depression, unspecified: Secondary | ICD-10-CM

## 2023-02-04 MED ORDER — METFORMIN HCL 500 MG PO TABS
500.0000 mg | ORAL_TABLET | Freq: Every day | ORAL | 0 refills | Status: DC
Start: 2023-02-04 — End: 2023-03-04
  Filled 2023-02-06: qty 60, 60d supply, fill #0

## 2023-02-04 MED ORDER — EZETIMIBE-SIMVASTATIN 10-20 MG PO TABS
1.0000 | ORAL_TABLET | Freq: Every day | ORAL | 1 refills | Status: AC
Start: 2023-02-04 — End: ?

## 2023-02-04 NOTE — Assessment & Plan Note (Signed)
Doing well on Lexapro.  Continue medication.

## 2023-02-04 NOTE — Assessment & Plan Note (Signed)
Counseling on recommended immunizations.  Tdap and Prevnar 20 given today.  Counseling on all components of the vaccines and potential side effects.

## 2023-02-04 NOTE — Progress Notes (Signed)
Subjective:     Patient ID: Austin Singleton, male    DOB: 10-Mar-1961, 62 y.o.   MRN: [id number]  Chief Complaint  Patient presents with   Diabetes    4 week f/u to discuss new dx diabetes, started metformin    HPI  Discussed the use of AI scribe software for clinical note transcription with the patient, who gave verbal consent to proceed.  History of Present Illness         Here to discuss new diagnosis of diabetes. A1c 6.8% on 01/02/2023  Started metformin 500 mg daily  Family hx of DM   HLD- LDL 107      Health Maintenance Due  Topic Date Due   FOOT EXAM  Never done   OPHTHALMOLOGY EXAM  Never done   HIV Screening  Never done   Diabetic kidney evaluation - Urine ACR  Never done   Hepatitis C Screening  Never done   Colonoscopy  Never done   Zoster Vaccines- Shingrix (1 of 2) Never done    Past Medical History:  Diagnosis Date   Diabetes (HCC)    Hyperlipidemia     History reviewed. No pertinent surgical history.  History reviewed. No pertinent family history.  Social History   Socioeconomic History   Marital status: Married    Spouse name: Not on file   Number of children: Not on file   Years of education: Not on file   Highest education level: Not on file  Occupational History   Not on file  Tobacco Use   Smoking status: Not on file   Smokeless tobacco: Not on file  Substance and Sexual Activity   Alcohol use: Not on file   Drug use: Not on file   Sexual activity: Not on file  Other Topics Concern   Not on file  Social History Narrative   Not on file   Social Determinants of Health   Financial Resource Strain: Not on file  Food Insecurity: Not on file  Transportation Needs: Not on file  Physical Activity: Not on file  Stress: Not on file  Social Connections: Not on file  Intimate Partner Violence: Not on file    Outpatient Medications Prior to Visit  Medication Sig Dispense Refill   Ascorbic Acid (VITAMIN C PO) Take by mouth.      Cyanocobalamin (B-12 PO) Take by mouth.     escitalopram (LEXAPRO) 10 MG tablet Take 1 tablet (10 mg total) by mouth daily. 90 tablet 1   fenofibrate (TRICOR) 48 MG tablet Take by mouth.     Fexofenadine-Pseudoephedrine (ALLEGRA-D 12 HOUR PO) Take by mouth.     fluticasone (FLONASE) 50 MCG/ACT nasal spray SPRAY TWICE A DAY AS NEEDED     meclizine (ANTIVERT) 25 MG tablet Take 1 tablet (25 mg total) by mouth 2 (two) times daily as needed for dizziness. 30 tablet 0   Misc Natural Products (URINOZINC PO) Take by mouth.     Omega-3 Fatty Acids (OMEGA 3 PO) Take by mouth.     omeprazole (PRILOSEC) 20 MG capsule Take 1 capsule by mouth daily.     pregabalin (LYRICA) 75 MG capsule TAKE 1 CAPSULE BY MOUTH TWICE A DAY 60 capsule 0   VITAMIN D PO Take by mouth.     ezetimibe-simvastatin (VYTORIN) 10-10 MG tablet Take 1 tablet by mouth daily.     metFORMIN (GLUCOPHAGE) 500 MG tablet Take 1 tablet (500 mg total) by mouth daily with breakfast. 90 tablet 0  No facility-administered medications prior to visit.    Not on File  Review of Systems  Constitutional:  Negative for chills, fever and malaise/fatigue.  Respiratory:  Negative for shortness of breath.   Cardiovascular:  Negative for chest pain and palpitations.  Gastrointestinal:  Negative for abdominal pain, constipation, diarrhea, nausea and vomiting.  Genitourinary:  Negative for dysuria, frequency and urgency.  Neurological:  Negative for dizziness, focal weakness and headaches.  Endo/Heme/Allergies:  Negative for polydipsia.  Psychiatric/Behavioral:  The patient is nervous/anxious.        Anxiety about health       Objective:    Physical Exam Constitutional:      General: He is not in acute distress.    Appearance: He is not ill-appearing.  Eyes:     Extraocular Movements: Extraocular movements intact.     Conjunctiva/sclera: Conjunctivae normal.  Cardiovascular:     Rate and Rhythm: Normal rate.  Pulmonary:     Effort:  Pulmonary effort is normal.  Musculoskeletal:     Cervical back: Normal range of motion and neck supple.  Skin:    General: Skin is warm and dry.  Neurological:     General: No focal deficit present.     Mental Status: He is alert and oriented to person, place, and time.  Psychiatric:        Mood and Affect: Mood normal.        Behavior: Behavior normal.        Thought Content: Thought content normal.      BP (!) 134/90 (BP Location: Left Arm, Patient Position: Sitting, Cuff Size: Large)   Pulse 80   Temp 97.6 F (36.4 C) (Temporal)   Ht 5\' 7"  (1.702 m)   Wt 195 lb (88.5 kg)   SpO2 96%   BMI 30.54 kg/m  Wt Readings from Last 3 Encounters:  02/04/23 195 lb (88.5 kg)  01/02/23 197 lb (89.4 kg)  12/12/22 197 lb (89.4 kg)        Assessment & Plan:   Problem List Items Addressed This Visit       Endocrine   New onset type 2 diabetes mellitus (HCC)    In-depth counseling on diabetes spectrum.  Discussed standards of care.  Recommend low carbohydrate, low sugar diet and getting adequate physical activity.  He will not start checking blood sugars at this point.  Continue metformin 500 mg daily with breakfast.  He is on statin therapy.  Reviewed labs from last month with patient.  Renal function normal.   Encouraged him to call and schedule diabetes eye exam. Follow-up in late August for his next A1c and other labs.       Relevant Medications   metFORMIN (GLUCOPHAGE) 500 MG tablet     Other   Depression    Doing well on Lexapro.  Continue medication.      Hyperlipidemia - Primary    Discussed that his LDL is not in goal range now that he has diabetes.  Increase simvastatin dose.  Continue other medications.      Relevant Medications   ezetimibe-simvastatin (VYTORIN) 10-20 MG tablet   Immunization due    Counseling on recommended immunizations.  Tdap and Prevnar 20 given today.  Counseling on all components of the vaccines and potential side effects.       Relevant Orders   Tdap vaccine greater than or equal to 7yo IM (Completed)   Pneumococcal conjugate vaccine 20-valent (Prevnar 20) (Completed)   Screen for colon  cancer    This will be his for screening colonoscopy.  Asymptomatic.      Relevant Orders   Ambulatory referral to Gastroenterology    I have discontinued Rulon Eisenmenger Rathje's ezetimibe-simvastatin. I am also having him start on ezetimibe-simvastatin. Additionally, I am having him maintain his omeprazole, fenofibrate, fluticasone, Fexofenadine-Pseudoephedrine (ALLEGRA-D 12 HOUR PO), Cyanocobalamin (B-12 PO), VITAMIN D PO, Misc Natural Products (URINOZINC PO), Ascorbic Acid (VITAMIN C PO), Omega-3 Fatty Acids (OMEGA 3 PO), meclizine, escitalopram, pregabalin, and metFORMIN.  Meds ordered this encounter  Medications   metFORMIN (GLUCOPHAGE) 500 MG tablet    Sig: Take 1 tablet (500 mg total) by mouth daily with breakfast.    Dispense:  90 tablet    Refill:  0   ezetimibe-simvastatin (VYTORIN) 10-20 MG tablet    Sig: Take 1 tablet by mouth at bedtime.    Dispense:  90 tablet    Refill:  1    Order Specific Question:   Supervising Provider    Answer:   Hillard Danker A [[id number]]

## 2023-02-04 NOTE — Patient Instructions (Signed)
Call and schedule a diabetes eye exam.   Continue metformin daily.   I am increasing your cholesterol medication and I sent the new prescription to your pharmacy.   You will hear from [facility] to schedule your colon cancer screening test.

## 2023-02-04 NOTE — Assessment & Plan Note (Signed)
This will be his for screening colonoscopy.  Asymptomatic.

## 2023-02-04 NOTE — Assessment & Plan Note (Signed)
Discussed that his LDL is not in goal range now that he has diabetes.  Increase simvastatin dose.  Continue other medications.

## 2023-02-04 NOTE — Assessment & Plan Note (Signed)
In-depth counseling on diabetes spectrum.  Discussed standards of care.  Recommend low carbohydrate, low sugar diet and getting adequate physical activity.  He will not start checking blood sugars at this point.  Continue metformin 500 mg daily with breakfast.  He is on statin therapy.  Reviewed labs from last month with patient.  Renal function normal.   Encouraged him to call and schedule diabetes eye exam. Follow-up in late August for his next A1c and other labs.

## 2023-02-06 ENCOUNTER — Other Ambulatory Visit (HOSPITAL_COMMUNITY): Payer: Self-pay

## 2023-03-04 ENCOUNTER — Other Ambulatory Visit: Payer: Self-pay | Admitting: Family Medicine

## 2023-03-04 ENCOUNTER — Other Ambulatory Visit (HOSPITAL_COMMUNITY): Payer: Self-pay

## 2023-03-04 DIAGNOSIS — E119 Type 2 diabetes mellitus without complications: Secondary | ICD-10-CM

## 2023-03-04 MED ORDER — METFORMIN HCL 500 MG PO TABS
500.0000 mg | ORAL_TABLET | Freq: Every day | ORAL | 0 refills | Status: DC
Start: 2023-03-04 — End: 2023-03-06
  Filled 2023-03-04 – 2023-03-05 (×2): qty 90, 90d supply, fill #0

## 2023-03-05 ENCOUNTER — Other Ambulatory Visit (HOSPITAL_COMMUNITY): Payer: Self-pay

## 2023-03-06 ENCOUNTER — Telehealth: Payer: Self-pay | Admitting: Family Medicine

## 2023-03-06 ENCOUNTER — Other Ambulatory Visit (HOSPITAL_COMMUNITY): Payer: Self-pay

## 2023-03-06 DIAGNOSIS — E785 Hyperlipidemia, unspecified: Secondary | ICD-10-CM

## 2023-03-06 MED ORDER — METFORMIN HCL 500 MG PO TABS
500.0000 mg | ORAL_TABLET | Freq: Every day | ORAL | 2 refills | Status: DC
  Filled 2023-07-06: qty 60, 60d supply, fill #0

## 2023-03-06 MED ORDER — METFORMIN HCL 500 MG PO TABS: 500.0000 mg | ORAL_TABLET | Freq: Every day | ORAL | 2 refills | Status: DC

## 2023-03-06 MED ORDER — SIMVASTATIN 20 MG PO TABS
20.0000 mg | ORAL_TABLET | Freq: Every day | ORAL | 3 refills | Status: DC
  Filled 2023-03-06: qty 90, 90d supply, fill #0

## 2023-03-06 MED ORDER — ESCITALOPRAM OXALATE 10 MG PO TABS
10.0000 mg | ORAL_TABLET | Freq: Every day | ORAL | 3 refills | Status: DC
  Filled 2023-03-06 – 2023-04-09 (×2): qty 90, 90d supply, fill #0

## 2023-03-06 NOTE — Telephone Encounter (Signed)
Patient's wife called and said ezetimibe-simvastatin (VYTORIN) 10-20 MG tablet was only filled for 30 days instead of 90. They would like to know if he can get a refill since he didn't have the full supply. They were advised to contact their insurance to see if there was an issue on that side. Best callback is [phone].

## 2023-03-09 MED ORDER — EZETIMIBE-SIMVASTATIN 10-20 MG PO TABS
1.0000 | ORAL_TABLET | Freq: Every day | ORAL | 1 refills | Status: DC
Start: 2023-03-09 — End: 2023-11-12

## 2023-03-09 NOTE — Telephone Encounter (Signed)
Looks like rx was sent originally for 90 days... will resend rx

## 2023-03-19 ENCOUNTER — Encounter (INDEPENDENT_AMBULATORY_CARE_PROVIDER_SITE_OTHER): Payer: Self-pay

## 2023-04-09 ENCOUNTER — Other Ambulatory Visit (HOSPITAL_COMMUNITY): Payer: Self-pay

## 2023-04-09 ENCOUNTER — Other Ambulatory Visit: Payer: Self-pay | Admitting: Family Medicine

## 2023-04-10 ENCOUNTER — Other Ambulatory Visit (HOSPITAL_COMMUNITY): Payer: Self-pay

## 2023-04-10 ENCOUNTER — Ambulatory Visit: Payer: 59 | Admitting: Family Medicine

## 2023-04-10 ENCOUNTER — Encounter: Payer: Self-pay | Admitting: Family Medicine

## 2023-04-10 VITALS — BP 132/86 | HR 65 | Temp 97.8°F | Ht 67.0 in | Wt 192.0 lb

## 2023-04-10 DIAGNOSIS — F32A Depression, unspecified: Secondary | ICD-10-CM

## 2023-04-10 DIAGNOSIS — Z1211 Encounter for screening for malignant neoplasm of colon: Secondary | ICD-10-CM

## 2023-04-10 DIAGNOSIS — E785 Hyperlipidemia, unspecified: Secondary | ICD-10-CM | POA: Diagnosis not present

## 2023-04-10 DIAGNOSIS — Z7984 Long term (current) use of oral hypoglycemic drugs: Secondary | ICD-10-CM | POA: Diagnosis not present

## 2023-04-10 DIAGNOSIS — J3089 Other allergic rhinitis: Secondary | ICD-10-CM | POA: Diagnosis not present

## 2023-04-10 DIAGNOSIS — E119 Type 2 diabetes mellitus without complications: Secondary | ICD-10-CM

## 2023-04-10 DIAGNOSIS — Z1159 Encounter for screening for other viral diseases: Secondary | ICD-10-CM | POA: Diagnosis not present

## 2023-04-10 DIAGNOSIS — K219 Gastro-esophageal reflux disease without esophagitis: Secondary | ICD-10-CM | POA: Diagnosis not present

## 2023-04-10 LAB — COMPREHENSIVE METABOLIC PANEL
ALT: 22 U/L (ref 0–53)
AST: 14 U/L (ref 0–37)
Albumin: 4 g/dL (ref 3.5–5.2)
Alkaline Phosphatase: 50 U/L (ref 39–117)
BUN: 19 mg/dL (ref 6–23)
CO2: 30 meq/L (ref 19–32)
Calcium: 9.6 mg/dL (ref 8.4–10.5)
Chloride: 103 meq/L (ref 96–112)
Creatinine, Ser: 0.86 mg/dL (ref 0.40–1.50)
GFR: 93.95 mL/min (ref >60.00)
Glucose, Bld: 116 mg/dL — ABNORMAL HIGH (ref 70–99)
Potassium: 4.2 meq/L (ref 3.5–5.1)
Sodium: 140 meq/L (ref 135–145)
Total Bilirubin: 0.5 mg/dL (ref 0.2–1.2)
Total Protein: 7.6 g/dL (ref 6.0–8.3)

## 2023-04-10 LAB — CBC WITH DIFFERENTIAL/PLATELET
Basophils Absolute: 0 10*3/uL (ref 0.0–0.1)
Basophils Relative: 0.6 % (ref 0.0–3.0)
Eosinophils Absolute: 0.1 10*3/uL (ref 0.0–0.7)
Eosinophils Relative: 1.6 % (ref 0.0–5.0)
HCT: 47.6 % (ref 39.0–52.0)
Hemoglobin: 15.5 g/dL (ref 13.0–17.0)
Lymphocytes Relative: 32.9 % (ref 12.0–46.0)
Lymphs Abs: 1.7 10*3/uL (ref 0.7–4.0)
MCHC: 32.5 g/dL (ref 30.0–36.0)
MCV: 87.5 fl (ref 78.0–100.0)
Monocytes Absolute: 0.4 10*3/uL (ref 0.1–1.0)
Monocytes Relative: 7.3 % (ref 3.0–12.0)
Neutro Abs: 2.9 10*3/uL (ref 1.4–7.7)
Neutrophils Relative %: 57.6 % (ref 43.0–77.0)
Platelets: 255 10*3/uL (ref 150.0–400.0)
RBC: 5.44 Mil/uL (ref 4.22–5.81)
RDW: 13.4 % (ref 11.5–15.5)
WBC: 5.1 10*3/uL (ref 4.0–10.5)

## 2023-04-10 LAB — LIPID PANEL
Cholesterol: 194 mg/dL (ref 0–200)
HDL: 45.4 mg/dL (ref >39.00)
LDL Cholesterol: 90 mg/dL (ref 0–99)
NonHDL: 148.49
Total CHOL/HDL Ratio: 4
Triglycerides: 290 mg/dL — ABNORMAL HIGH (ref 0.0–149.0)
VLDL: 58 mg/dL — ABNORMAL HIGH (ref 0.0–40.0)

## 2023-04-10 LAB — MICROALBUMIN / CREATININE URINE RATIO
Creatinine,U: 143.5 mg/dL
Microalb Creat Ratio: 0.5 mg/g (ref 0.0–30.0)
Microalb, Ur: <0.7 mg/dL (ref 0.0–1.9)

## 2023-04-10 LAB — HEMOGLOBIN A1C: Hgb A1c MFr Bld: 6.4 % (ref 4.6–6.5)

## 2023-04-10 LAB — VITAMIN B12: Vitamin B-12: 586 pg/mL (ref 211–911)

## 2023-04-10 MED ORDER — OMEPRAZOLE 20 MG PO CPDR
20.0000 mg | DELAYED_RELEASE_CAPSULE | Freq: Every day | ORAL | 1 refills | Status: DC
Start: 2023-04-10 — End: 2024-02-12
  Filled 2023-04-10: qty 90, 90d supply, fill #0
  Filled 2023-08-30: qty 90, 90d supply, fill #1

## 2023-04-10 MED ORDER — FLUTICASONE PROPIONATE 50 MCG/ACT NA SUSP
2.0000 | Freq: Every day | NASAL | 6 refills | Status: DC
Start: 2023-04-10 — End: 2023-11-12
  Filled 2023-04-10: qty 16, 30d supply, fill #0
  Filled 2023-05-29: qty 16, 30d supply, fill #1
  Filled 2023-07-06: qty 16, 30d supply, fill #2
  Filled 2023-08-30: qty 16, 30d supply, fill #3

## 2023-04-10 NOTE — Progress Notes (Unsigned)
Subjective:     Patient ID: Austin Singleton, male    DOB: 08-13-1960, 62 y.o.   MRN: [id number]  Chief Complaint  Patient presents with   Medical Management of Chronic Issues    8 week f/u    HPI  Discussed the use of AI scribe software for clinical note transcription with the patient, who gave verbal consent to proceed.  History of Present Illness         Here for follow up on DM, HLD and depression.  A1c 6.8% and new DM diagnosis  Started on metformin in July.  Does not check BS at home.   Increased Vytorin dose last visit   Eye exam?   Colonoscopy- refused. Will do Cologuard    History of prostate biopsy    Health Maintenance Due  Topic Date Due   FOOT EXAM  Never done   OPHTHALMOLOGY EXAM  Never done   HIV Screening  Never done   Diabetic kidney evaluation - Urine ACR  Never done   Hepatitis C Screening  Never done   Colonoscopy  Never done   Zoster Vaccines- Shingrix (1 of 2) Never done   INFLUENZA VACCINE  Never done    Past Medical History:  Diagnosis Date   Diabetes (HCC)    Hyperlipidemia     History reviewed. No pertinent surgical history.  History reviewed. No pertinent family history.  Social History   Socioeconomic History   Marital status: Married    Spouse name: Not on file   Number of children: Not on file   Years of education: Not on file   Highest education level: Not on file  Occupational History   Not on file  Tobacco Use   Smoking status: Not on file   Smokeless tobacco: Not on file  Substance and Sexual Activity   Alcohol use: Not on file   Drug use: Not on file   Sexual activity: Not on file  Other Topics Concern   Not on file  Social History Narrative   Not on file   Social Determinants of Health   Financial Resource Strain: Not on file  Food Insecurity: Not on file  Transportation Needs: Not on file  Physical Activity: Not on file  Stress: Not on file  Social Connections: Not on file  Intimate Partner  Violence: Not on file    Outpatient Medications Prior to Visit  Medication Sig Dispense Refill   Ascorbic Acid (VITAMIN C PO) Take by mouth.     Cyanocobalamin (B-12 PO) Take by mouth.     escitalopram (LEXAPRO) 10 MG tablet Take 1 tablet (10 mg total) by mouth daily. 30 tablet 4   escitalopram (LEXAPRO) 10 MG tablet Take 1 tablet (10 mg total) by mouth daily. 90 tablet 3   ezetimibe-simvastatin (VYTORIN) 10-20 MG tablet Take 1 tablet by mouth at bedtime. 90 tablet 1   fenofibrate (TRICOR) 48 MG tablet Take by mouth.     Fexofenadine-Pseudoephedrine (ALLEGRA-D 12 HOUR PO) Take by mouth.     meclizine (ANTIVERT) 25 MG tablet Take 1 tablet (25 mg total) by mouth 2 (two) times daily as needed for dizziness. 30 tablet 0   metFORMIN (GLUCOPHAGE) 500 MG tablet Take 1 tablet (500 mg total) by mouth daily with breakfast. 90 tablet 2   Misc Natural Products (URINOZINC PO) Take by mouth.     Omega-3 Fatty Acids (OMEGA 3 PO) Take by mouth.     pregabalin (LYRICA) 75 MG capsule TAKE 1  CAPSULE BY MOUTH TWICE A DAY 60 capsule 0   simvastatin (ZOCOR) 20 MG tablet Take 1 tablet (20 mg total) by mouth daily. 90 tablet 3   VITAMIN D PO Take by mouth.     fluticasone (FLONASE) 50 MCG/ACT nasal spray SPRAY TWICE A DAY AS NEEDED     omeprazole (PRILOSEC) 20 MG capsule Take 1 capsule by mouth daily. (Patient not taking: Reported on 04/10/2023)     No facility-administered medications prior to visit.    Not on File  Review of Systems  Constitutional:  Negative for chills, fever, malaise/fatigue and weight loss.  Respiratory:  Negative for shortness of breath.   Cardiovascular:  Negative for chest pain, palpitations and leg swelling.  Gastrointestinal:  Negative for abdominal pain, constipation, diarrhea, nausea and vomiting.  Genitourinary:  Negative for dysuria, frequency and urgency.  Neurological:  Negative for dizziness and focal weakness.  Psychiatric/Behavioral:  Negative for depression. The patient  is not nervous/anxious.        Objective:    Physical Exam Constitutional:      General: He is not in acute distress.    Appearance: He is not ill-appearing.  HENT:     Mouth/Throat:     Mouth: Mucous membranes are moist.  Eyes:     Extraocular Movements: Extraocular movements intact.     Conjunctiva/sclera: Conjunctivae normal.  Cardiovascular:     Rate and Rhythm: Normal rate and regular rhythm.  Pulmonary:     Effort: Pulmonary effort is normal.     Breath sounds: Normal breath sounds.  Musculoskeletal:     Cervical back: Normal range of motion and neck supple.     Right lower leg: No edema.     Left lower leg: No edema.  Skin:    General: Skin is warm and dry.  Neurological:     General: No focal deficit present.     Mental Status: He is alert and oriented to person, place, and time.     Cranial Nerves: No cranial nerve deficit.     Motor: No weakness.     Coordination: Coordination normal.     Gait: Gait normal.  Psychiatric:        Mood and Affect: Mood normal.        Behavior: Behavior normal.        Thought Content: Thought content normal.      BP 132/86 (BP Location: Left Arm, Patient Position: Sitting, Cuff Size: Large)   Pulse 65   Temp 97.8 F (36.6 C) (Temporal)   Ht 5\' 7"  (1.702 m)   Wt 192 lb (87.1 kg)   SpO2 97%   BMI 30.07 kg/m  Wt Readings from Last 3 Encounters:  04/10/23 192 lb (87.1 kg)  02/04/23 195 lb (88.5 kg)  01/02/23 197 lb (89.4 kg)       Assessment & Plan:   Problem List Items Addressed This Visit       Digestive   Gastroesophageal reflux disease   Relevant Medications   omeprazole (PRILOSEC) 20 MG capsule     Endocrine   New onset type 2 diabetes mellitus (HCC) - Primary   Relevant Orders   CBC with Differential/Platelet   Comprehensive metabolic panel   Hemoglobin A1c   Vitamin B12   Microalbumin / creatinine urine ratio     Other   Depression   Hyperlipidemia   Relevant Orders   Lipid panel   Screen for  colon cancer   Relevant Orders  Cologuard   Other Visit Diagnoses     Environmental and seasonal allergies       Relevant Medications   fluticasone (FLONASE) 50 MCG/ACT nasal spray   Encounter for screening for other viral diseases       Relevant Orders   Hepatitis C antibody   HIV Antibody (routine testing w rflx)       I have discontinued Ikey Russett's fluticasone. I have also changed his omeprazole. Additionally, I am having him start on fluticasone. Lastly, I am having him maintain his fenofibrate, Fexofenadine-Pseudoephedrine (ALLEGRA-D 12 HOUR PO), Cyanocobalamin (B-12 PO), VITAMIN D PO, Misc Natural Products (URINOZINC PO), Ascorbic Acid (VITAMIN C PO), Omega-3 Fatty Acids (OMEGA 3 PO), meclizine, escitalopram, pregabalin, escitalopram, simvastatin, metFORMIN, and ezetimibe-simvastatin.  Meds ordered this encounter  Medications   omeprazole (PRILOSEC) 20 MG capsule    Sig: Take 1 capsule (20 mg total) by mouth daily.    Dispense:  90 capsule    Refill:  1   fluticasone (FLONASE) 50 MCG/ACT nasal spray    Sig: Place 2 sprays into both nostrils daily.    Dispense:  16 g    Refill:  6    Order Specific Question:   Supervising Provider    Answer:   Hillard Danker A [[id number]]

## 2023-04-11 LAB — HIV ANTIBODY (ROUTINE TESTING W REFLEX): HIV 1&2 Ab, 4th Generation: NONREACTIVE

## 2023-04-11 LAB — HEPATITIS C ANTIBODY: Hepatitis C Ab: NONREACTIVE

## 2023-04-12 NOTE — Assessment & Plan Note (Signed)
Increased simvastatin last visit. Follow up pending lipid panel.

## 2023-04-12 NOTE — Assessment & Plan Note (Signed)
Declines colonoscopy   Cologuard ordered

## 2023-04-12 NOTE — Assessment & Plan Note (Signed)
Discussed standards of care.  Recommend low carbohydrate, low sugar diet and getting adequate physical activity.  He will not start checking blood sugars at this point.  Continue metformin 500 mg daily with breakfast.  He is on statin therapy.    Follow up pending A1c and renal function.  Encouraged him to call and schedule diabetes eye exam.

## 2023-04-12 NOTE — Assessment & Plan Note (Signed)
Doing well on Lexapro.  Continue medication.

## 2023-04-21 DIAGNOSIS — Z1211 Encounter for screening for malignant neoplasm of colon: Secondary | ICD-10-CM | POA: Diagnosis not present

## 2023-04-26 LAB — COLOGUARD: COLOGUARD: NEGATIVE

## 2023-05-20 ENCOUNTER — Other Ambulatory Visit (HOSPITAL_COMMUNITY): Payer: Self-pay

## 2023-05-28 ENCOUNTER — Other Ambulatory Visit: Payer: Self-pay

## 2023-05-29 ENCOUNTER — Other Ambulatory Visit (HOSPITAL_COMMUNITY): Payer: Self-pay

## 2023-05-31 ENCOUNTER — Encounter: Payer: Self-pay | Admitting: Orthopedic Surgery

## 2023-06-05 ENCOUNTER — Other Ambulatory Visit: Payer: Self-pay | Admitting: Family Medicine

## 2023-06-05 ENCOUNTER — Other Ambulatory Visit (HOSPITAL_COMMUNITY): Payer: Self-pay

## 2023-06-05 MED ORDER — SIMVASTATIN 20 MG PO TABS
20.0000 mg | ORAL_TABLET | Freq: Every day | ORAL | 0 refills | Status: DC
  Filled 2023-06-05: qty 90, 90d supply, fill #0

## 2023-06-05 NOTE — Telephone Encounter (Signed)
Patient only has 1 left - patient wants to pick it up tomorrow morning

## 2023-07-06 ENCOUNTER — Other Ambulatory Visit (HOSPITAL_COMMUNITY): Payer: Self-pay

## 2023-08-30 ENCOUNTER — Other Ambulatory Visit: Payer: Self-pay | Admitting: Family Medicine

## 2023-08-30 ENCOUNTER — Other Ambulatory Visit (HOSPITAL_COMMUNITY): Payer: Self-pay

## 2023-08-31 ENCOUNTER — Other Ambulatory Visit: Payer: Self-pay

## 2023-09-01 ENCOUNTER — Other Ambulatory Visit: Payer: Self-pay

## 2023-09-01 ENCOUNTER — Other Ambulatory Visit (HOSPITAL_COMMUNITY): Payer: Self-pay

## 2023-09-01 MED ORDER — SIMVASTATIN 20 MG PO TABS
20.0000 mg | ORAL_TABLET | Freq: Every day | ORAL | 0 refills | Status: DC
  Filled 2023-09-01 (×2): qty 30, 30d supply, fill #0

## 2023-09-01 MED FILL — Metformin HCl Tab 500 MG: ORAL | 30 days supply | Qty: 30 | Fill #0 | Status: CN

## 2023-09-01 MED FILL — Metformin HCl Tab 500 MG: ORAL | 30 days supply | Qty: 30 | Fill #0 | Status: AC

## 2023-11-11 ENCOUNTER — Other Ambulatory Visit: Payer: Self-pay | Admitting: Family Medicine

## 2023-11-11 ENCOUNTER — Other Ambulatory Visit (HOSPITAL_COMMUNITY): Payer: Self-pay

## 2023-11-12 ENCOUNTER — Ambulatory Visit (INDEPENDENT_AMBULATORY_CARE_PROVIDER_SITE_OTHER): Admitting: Family Medicine

## 2023-11-12 ENCOUNTER — Other Ambulatory Visit (HOSPITAL_COMMUNITY): Payer: Self-pay

## 2023-11-12 ENCOUNTER — Encounter: Payer: Self-pay | Admitting: Family Medicine

## 2023-11-12 ENCOUNTER — Other Ambulatory Visit: Payer: Self-pay

## 2023-11-12 VITALS — BP 130/86 | HR 67 | Temp 97.8°F | Ht 67.0 in | Wt 192.0 lb

## 2023-11-12 DIAGNOSIS — F32A Depression, unspecified: Secondary | ICD-10-CM | POA: Diagnosis not present

## 2023-11-12 DIAGNOSIS — M5412 Radiculopathy, cervical region: Secondary | ICD-10-CM | POA: Diagnosis not present

## 2023-11-12 DIAGNOSIS — E785 Hyperlipidemia, unspecified: Secondary | ICD-10-CM

## 2023-11-12 DIAGNOSIS — J3089 Other allergic rhinitis: Secondary | ICD-10-CM | POA: Insufficient documentation

## 2023-11-12 DIAGNOSIS — E119 Type 2 diabetes mellitus without complications: Secondary | ICD-10-CM | POA: Diagnosis not present

## 2023-11-12 DIAGNOSIS — M4802 Spinal stenosis, cervical region: Secondary | ICD-10-CM | POA: Diagnosis not present

## 2023-11-12 DIAGNOSIS — Z7984 Long term (current) use of oral hypoglycemic drugs: Secondary | ICD-10-CM

## 2023-11-12 LAB — CBC WITH DIFFERENTIAL/PLATELET
Basophils Absolute: 0 10*3/uL (ref 0.0–0.1)
Basophils Relative: 0.3 % (ref 0.0–3.0)
Eosinophils Absolute: 0.1 10*3/uL (ref 0.0–0.7)
Eosinophils Relative: 1.3 % (ref 0.0–5.0)
HCT: 49.1 % (ref 39.0–52.0)
Hemoglobin: 16.4 g/dL (ref 13.0–17.0)
Lymphocytes Relative: 35.7 % (ref 12.0–46.0)
Lymphs Abs: 2.1 10*3/uL (ref 0.7–4.0)
MCHC: 33.3 g/dL (ref 30.0–36.0)
MCV: 87.3 fl (ref 78.0–100.0)
Monocytes Absolute: 0.5 10*3/uL (ref 0.1–1.0)
Monocytes Relative: 8.4 % (ref 3.0–12.0)
Neutro Abs: 3.3 10*3/uL (ref 1.4–7.7)
Neutrophils Relative %: 54.3 % (ref 43.0–77.0)
Platelets: 259 10*3/uL (ref 150.0–400.0)
RBC: 5.62 Mil/uL (ref 4.22–5.81)
RDW: 14.1 % (ref 11.5–15.5)
WBC: 6 10*3/uL (ref 4.0–10.5)

## 2023-11-12 LAB — COMPREHENSIVE METABOLIC PANEL WITH GFR
ALT: 24 U/L (ref 0–53)
AST: 16 U/L (ref 0–37)
Albumin: 4.5 g/dL (ref 3.5–5.2)
Alkaline Phosphatase: 55 U/L (ref 39–117)
BUN: 11 mg/dL (ref 6–23)
CO2: 33 meq/L — ABNORMAL HIGH (ref 19–32)
Calcium: 9.9 mg/dL (ref 8.4–10.5)
Chloride: 100 meq/L (ref 96–112)
Creatinine, Ser: 0.92 mg/dL (ref 0.40–1.50)
GFR: 89.88 mL/min (ref >60.00)
Glucose, Bld: 109 mg/dL — ABNORMAL HIGH (ref 70–99)
Potassium: 4.5 meq/L (ref 3.5–5.1)
Sodium: 138 meq/L (ref 135–145)
Total Bilirubin: 0.4 mg/dL (ref 0.2–1.2)
Total Protein: 7.8 g/dL (ref 6.0–8.3)

## 2023-11-12 LAB — LIPID PANEL
Cholesterol: 225 mg/dL — ABNORMAL HIGH (ref 0–200)
HDL: 47.5 mg/dL (ref >39.00)
LDL Cholesterol: 108 mg/dL — ABNORMAL HIGH (ref 0–99)
NonHDL: 177.71
Total CHOL/HDL Ratio: 5
Triglycerides: 350 mg/dL — ABNORMAL HIGH (ref 0.0–149.0)
VLDL: 70 mg/dL — ABNORMAL HIGH (ref 0.0–40.0)

## 2023-11-12 LAB — HEMOGLOBIN A1C: Hgb A1c MFr Bld: 6.6 % — ABNORMAL HIGH (ref 4.6–6.5)

## 2023-11-12 LAB — TSH: TSH: 3.87 u[IU]/mL (ref 0.35–5.50)

## 2023-11-12 MED ORDER — EZETIMIBE-SIMVASTATIN 10-20 MG PO TABS
1.0000 | ORAL_TABLET | Freq: Every day | ORAL | 1 refills | Status: DC
  Filled 2023-11-12: qty 90, 90d supply, fill #0
  Filled 2024-02-25: qty 90, 90d supply, fill #1

## 2023-11-12 MED ORDER — METFORMIN HCL 500 MG PO TABS
500.0000 mg | ORAL_TABLET | Freq: Every day | ORAL | 0 refills | Status: DC
  Filled 2023-11-12: qty 90, 90d supply, fill #0

## 2023-11-12 MED ORDER — FLUTICASONE PROPIONATE 50 MCG/ACT NA SUSP
2.0000 | Freq: Every day | NASAL | 6 refills | Status: DC
  Filled 2023-11-12: qty 16, 30d supply, fill #0
  Filled 2023-12-12: qty 16, 30d supply, fill #1
  Filled 2024-02-12: qty 16, 30d supply, fill #2
  Filled 2024-07-20: qty 16, 30d supply, fill #3

## 2023-11-12 MED ORDER — GABAPENTIN 300 MG PO CAPS
300.0000 mg | ORAL_CAPSULE | Freq: Every day | ORAL | 2 refills | Status: AC
Start: 2023-11-12 — End: ?
  Filled 2023-11-12 (×3): qty 30, 30d supply, fill #0
  Filled 2023-12-12: qty 30, 30d supply, fill #1

## 2023-11-12 NOTE — Assessment & Plan Note (Signed)
Doing well on Lexapro.  Continue medication.

## 2023-11-12 NOTE — Progress Notes (Signed)
 Subjective:     Patient ID: Austin Singleton, male    DOB: [birth year], 63 y.o.   MRN: [id number]  Chief Complaint  Patient presents with   Follow-up    Medication, wants to discuss gabapentin 500 mg rx, taking old rx    HPI   History of Present Illness          He is here to follow-up on chronic health conditions. DM- does not check BS at home  Taking metformin daily   Overdue for eye exam.   On Vytorin and fenofibrate.   States mood is good on Lexapro   Hx of cervical stenosis with radiculopathy and has tried PT, gabapentin, and Lyrica prescribed by Dr. Christell Singleton.  Declined injection at the time.   States he is taking an old prescription of gabapentin and that is works better than Lyrica.    Health Maintenance Due  Topic Date Due   FOOT EXAM  Never done   OPHTHALMOLOGY EXAM  Never done   Zoster Vaccines- Shingrix (1 of 2) Never done    Past Medical History:  Diagnosis Date   Diabetes (HCC)    Hyperlipidemia     History reviewed. No pertinent surgical history.  History reviewed. No pertinent family history.  Social History   Socioeconomic History   Marital status: Married    Spouse name: Not on file   Number of children: Not on file   Years of education: Not on file   Highest education level: Not on file  Occupational History   Not on file  Tobacco Use   Smoking status: Never   Smokeless tobacco: Never  Substance and Sexual Activity   Alcohol use: Not Currently   Drug use: Never   Sexual activity: Not on file  Other Topics Concern   Not on file  Social History Narrative   Not on file   Social Drivers of Health   Financial Resource Strain: Not on file  Food Insecurity: Not on file  Transportation Needs: Not on file  Physical Activity: Not on file  Stress: Not on file  Social Connections: Not on file  Intimate Partner Violence: Not on file    Outpatient Medications Prior to Visit  Medication Sig Dispense Refill   Ascorbic Acid (VITAMIN C  PO) Take by mouth.     Cyanocobalamin (B-12 PO) Take by mouth.     escitalopram (LEXAPRO) 10 MG tablet Take 1 tablet (10 mg total) by mouth daily. 30 tablet 4   fenofibrate (TRICOR) 48 MG tablet Take by mouth.     Fexofenadine-Pseudoephedrine (ALLEGRA-D 12 HOUR PO) Take by mouth.     meclizine (ANTIVERT) 25 MG tablet Take 1 tablet (25 mg total) by mouth 2 (two) times daily as needed for dizziness. 30 tablet 0   Misc Natural Products (URINOZINC PO) Take by mouth.     Omega-3 Fatty Acids (OMEGA 3 PO) Take by mouth.     omeprazole (PRILOSEC) 20 MG capsule Take 1 capsule (20 mg total) by mouth daily. 90 capsule 1   VITAMIN D PO Take by mouth.     escitalopram (LEXAPRO) 10 MG tablet Take 1 tablet (10 mg total) by mouth daily. 90 tablet 3   ezetimibe-simvastatin (VYTORIN) 10-20 MG tablet Take 1 tablet by mouth at bedtime. 90 tablet 1   fluticasone (FLONASE) 50 MCG/ACT nasal spray Place 2 sprays into both nostrils daily. 16 g 6   metFORMIN (GLUCOPHAGE) 500 MG tablet Take 1 tablet (500 mg total) by mouth  daily with breakfast. Please schedule appointment for further refills. 30 tablet 0   pregabalin (LYRICA) 75 MG capsule TAKE 1 CAPSULE BY MOUTH TWICE A DAY 60 capsule 0   simvastatin (ZOCOR) 20 MG tablet Take 1 tablet (20 mg total) by mouth daily. Please schedule appointment for further refills. 30 tablet 0   No facility-administered medications prior to visit.    Not on File  Review of Systems  Constitutional:  Negative for chills, fever and malaise/fatigue.  Respiratory:  Positive for sputum production. Negative for shortness of breath.   Cardiovascular:  Negative for chest pain, palpitations and leg swelling.  Gastrointestinal:  Negative for abdominal pain, constipation, diarrhea, nausea and vomiting.  Genitourinary:  Negative for dysuria, frequency and urgency.  Musculoskeletal:  Positive for neck pain.  Neurological:  Negative for dizziness, focal weakness and headaches.   Psychiatric/Behavioral:  Negative for depression. The patient is not nervous/anxious.        Objective:    Physical Exam Constitutional:      General: He is not in acute distress.    Appearance: He is not ill-appearing.  Eyes:     Extraocular Movements: Extraocular movements intact.     Conjunctiva/sclera: Conjunctivae normal.  Cardiovascular:     Rate and Rhythm: Normal rate and regular rhythm.  Pulmonary:     Effort: Pulmonary effort is normal.     Breath sounds: Normal breath sounds.  Musculoskeletal:     Cervical back: Normal range of motion and neck supple.  Skin:    General: Skin is warm and dry.  Neurological:     General: No focal deficit present.     Mental Status: He is alert and oriented to person, place, and time.     Motor: No weakness.     Coordination: Coordination normal.     Gait: Gait normal.  Psychiatric:        Mood and Affect: Mood normal.        Behavior: Behavior normal.        Thought Content: Thought content normal.      BP 130/86 (BP Location: Left Arm, Patient Position: Sitting, Cuff Size: Normal)   Pulse 67   Temp 97.8 F (36.6 C) (Temporal)   Ht 5\' 7"  (1.702 m)   Wt 192 lb (87.1 kg)   SpO2 95%   BMI 30.07 kg/m  Wt Readings from Last 3 Encounters:  11/12/23 192 lb (87.1 kg)  04/10/23 192 lb (87.1 kg)  02/04/23 195 lb (88.5 kg)       Assessment & Plan:   Problem List Items Addressed This Visit     Cervical radiculopathy   He last saw Dr. Christell Singleton last year. Requests refill of gabapentin.       Relevant Medications   gabapentin (NEURONTIN) 300 MG capsule   Cervical stenosis of spinal canal   Depression   Doing well on Lexapro.  Continue medication.      Environmental and seasonal allergies   Switch from Allegra D to OTC Zyrtec or Xyzal. Refilled Flonase       Relevant Medications   fluticasone (FLONASE) 50 MCG/ACT nasal spray   Hyperlipidemia   Follow up pending lipid panel      Relevant Medications    ezetimibe-simvastatin (VYTORIN) 10-20 MG tablet   Other Relevant Orders   Lipid panel (Completed)   Type 2 diabetes mellitus without complication, without long-term current use of insulin (HCC) - Primary    Recommend low carbohydrate, low sugar diet and getting  adequate physical activity.  He will not start checking blood sugars at this point.  Continue metformin 500 mg daily with breakfast.  He is on statin therapy.    Follow up pending A1c and renal function.  Encouraged him to call and schedule diabetes eye exam.        Relevant Medications   metFORMIN (GLUCOPHAGE) 500 MG tablet   Other Relevant Orders   CBC with Differential/Platelet (Completed)   Comprehensive metabolic panel with GFR (Completed)   Hemoglobin A1c (Completed)   TSH (Completed)    I have discontinued Rulon Eisenmenger Leinberger's pregabalin. I have also changed his metFORMIN. Additionally, I am having him start on gabapentin. Lastly, I am having him maintain his fenofibrate, Fexofenadine-Pseudoephedrine (ALLEGRA-D 12 HOUR PO), Cyanocobalamin (B-12 PO), VITAMIN D PO, Misc Natural Products (URINOZINC PO), Ascorbic Acid (VITAMIN C PO), Omega-3 Fatty Acids (OMEGA 3 PO), meclizine, escitalopram, omeprazole, ezetimibe-simvastatin, and fluticasone.  Meds ordered this encounter  Medications   metFORMIN (GLUCOPHAGE) 500 MG tablet    Sig: Take 1 tablet (500 mg total) by mouth daily with breakfast.    Dispense:  90 tablet    Refill:  0   ezetimibe-simvastatin (VYTORIN) 10-20 MG tablet    Sig: Take 1 tablet by mouth at bedtime.    Dispense:  90 tablet    Refill:  1    Supervising Provider:   Hillard Danker A [[id number]]   fluticasone (FLONASE) 50 MCG/ACT nasal spray    Sig: Place 2 sprays into both nostrils daily.    Dispense:  16 g    Refill:  6    Supervising Provider:   Hillard Danker A [[id number]]   gabapentin (NEURONTIN) 300 MG capsule    Sig: Take 1 capsule (300 mg total) by mouth at bedtime.    Dispense:  30 capsule     Refill:  2    Supervising Provider:   Hillard Danker A [[id number]]

## 2023-11-12 NOTE — Assessment & Plan Note (Signed)
 He last saw Dr. Christell Constant last year. Requests refill of gabapentin.

## 2023-11-12 NOTE — Patient Instructions (Addendum)
 Stop using Allegra - D and start using Claritin or Allegra or Zyrtec or Xyzal only (NO D)  Continue Flonase.   Continue gabapentin at bedtime.    Please call and schedule a diabetic eye exam

## 2023-11-12 NOTE — Assessment & Plan Note (Signed)
 Switch from Allegra D to OTC Zyrtec or Xyzal. Refilled Flonase

## 2023-11-12 NOTE — Assessment & Plan Note (Signed)
 Recommend low carbohydrate, low sugar diet and getting adequate physical activity.  He will not start checking blood sugars at this point.  Continue metformin 500 mg daily with breakfast.  He is on statin therapy.    Follow up pending A1c and renal function.  Encouraged him to call and schedule diabetes eye exam.

## 2023-11-12 NOTE — Assessment & Plan Note (Signed)
Follow up pending lipid panel

## 2023-11-13 ENCOUNTER — Other Ambulatory Visit: Payer: Self-pay | Admitting: Family Medicine

## 2023-11-13 ENCOUNTER — Other Ambulatory Visit: Payer: Self-pay

## 2023-11-13 ENCOUNTER — Encounter: Payer: Self-pay | Admitting: Family Medicine

## 2023-11-13 DIAGNOSIS — E782 Mixed hyperlipidemia: Secondary | ICD-10-CM

## 2023-11-13 NOTE — Progress Notes (Signed)
 Please ask him to fast for 8 hours and come in for a lab visit to recheck cholesterol. I would like to get make sure we get an accurate reading. Also, please ask him if he is taking all of his cholesterol medications (Vytorin and fenofibrate) everyday or does he sometimes miss days.

## 2023-11-17 ENCOUNTER — Other Ambulatory Visit (HOSPITAL_COMMUNITY): Payer: Self-pay

## 2023-11-17 ENCOUNTER — Other Ambulatory Visit: Payer: Self-pay

## 2023-11-17 MED ORDER — FENOFIBRATE 48 MG PO TABS
ORAL_TABLET | ORAL | 1 refills | Status: DC
  Filled 2023-11-17: qty 90, 90d supply, fill #0
  Filled 2024-02-25: qty 90, 90d supply, fill #1

## 2023-11-18 ENCOUNTER — Other Ambulatory Visit (INDEPENDENT_AMBULATORY_CARE_PROVIDER_SITE_OTHER)

## 2023-11-18 DIAGNOSIS — E782 Mixed hyperlipidemia: Secondary | ICD-10-CM | POA: Diagnosis not present

## 2023-11-18 LAB — LDL CHOLESTEROL, DIRECT: Direct LDL: 67 mg/dL

## 2023-11-18 LAB — LIPID PANEL
Cholesterol: 188 mg/dL (ref 0–200)
HDL: 34.4 mg/dL — ABNORMAL LOW (ref >39.00)
NonHDL: 153.67
Total CHOL/HDL Ratio: 5
Triglycerides: 432 mg/dL — ABNORMAL HIGH (ref 0.0–149.0)
VLDL: 86.4 mg/dL — ABNORMAL HIGH (ref 0.0–40.0)

## 2023-11-18 NOTE — Progress Notes (Signed)
 His LDL is in a good range but his triglycerides are significantly elevated at 432. This should be better controlled if he is taking his medications daily. Please confirm. If he is taking Vytorin and fenofibrate daily, I will refer him to the lipid clinic.

## 2023-11-21 ENCOUNTER — Other Ambulatory Visit (HOSPITAL_COMMUNITY): Payer: Self-pay

## 2023-11-24 ENCOUNTER — Other Ambulatory Visit (HOSPITAL_COMMUNITY): Payer: Self-pay

## 2023-11-24 ENCOUNTER — Telehealth: Payer: Self-pay | Admitting: Family Medicine

## 2023-11-24 DIAGNOSIS — E782 Mixed hyperlipidemia: Secondary | ICD-10-CM

## 2023-11-24 NOTE — Telephone Encounter (Signed)
 FYI... follow up from lab result comment

## 2023-11-24 NOTE — Telephone Encounter (Signed)
 Spoke with patient's wife with patient permission: She states patient is taking :  fenofibrate  (TRICOR ) 48 MG tablet  Patient picked up- but has not started- he was finishing his simvastatin  only- he will start tomorrow  ezetimibe -simvastatin  (VYTORIN ) 10-20 MG tablet   When does provider want him to come for lab given this information?   Copied from CRM [id number]. Topic: Clinical - Prescription Issue >> Nov 24, 2023  3:26 PM Alpha Arts wrote: Reason for CRM: Patient wants to know where and when should he pick up Vytorin   Note from [place] on 11/13/23: Please ask him to fast for 8 hours and come in for a lab visit to recheck cholesterol. I would like to get make sure we get an accurate reading. Also, please ask him if he is taking all of his cholesterol medications (Vytorin  and fenofibrate ) everyday or does he sometimes miss days.   Callback #: [phone]

## 2023-11-25 NOTE — Addendum Note (Signed)
 Addended by: Joei Frangos E on: 11/25/2023 02:30 PM   Modules accepted: Orders

## 2023-11-25 NOTE — Telephone Encounter (Signed)
 Called and spoke w wife who translated for pt that he needs to be taking both medications. Pt verbalized understanding and booked fasting lab appt for 4 weeks to recheck lipids

## 2023-12-12 ENCOUNTER — Other Ambulatory Visit (HOSPITAL_COMMUNITY): Payer: Self-pay

## 2023-12-23 ENCOUNTER — Ambulatory Visit: Payer: Self-pay | Admitting: Family Medicine

## 2023-12-23 ENCOUNTER — Other Ambulatory Visit (INDEPENDENT_AMBULATORY_CARE_PROVIDER_SITE_OTHER)

## 2023-12-23 DIAGNOSIS — E782 Mixed hyperlipidemia: Secondary | ICD-10-CM

## 2023-12-23 LAB — LIPID PANEL
Cholesterol: 178 mg/dL (ref 0–200)
HDL: 48 mg/dL (ref >39.00)
LDL Cholesterol: 87 mg/dL (ref 0–99)
NonHDL: 129.67
Total CHOL/HDL Ratio: 4
Triglycerides: 215 mg/dL — ABNORMAL HIGH (ref 0.0–149.0)
VLDL: 43 mg/dL — ABNORMAL HIGH (ref 0.0–40.0)

## 2024-02-12 ENCOUNTER — Encounter: Payer: Self-pay | Admitting: Family Medicine

## 2024-02-12 ENCOUNTER — Other Ambulatory Visit (HOSPITAL_COMMUNITY): Payer: Self-pay

## 2024-02-12 ENCOUNTER — Ambulatory Visit: Payer: Self-pay | Admitting: Family Medicine

## 2024-02-12 ENCOUNTER — Ambulatory Visit: Admitting: Family Medicine

## 2024-02-12 VITALS — BP 120/84 | HR 65 | Temp 97.6°F | Ht 67.0 in | Wt 196.0 lb

## 2024-02-12 DIAGNOSIS — E119 Type 2 diabetes mellitus without complications: Secondary | ICD-10-CM

## 2024-02-12 DIAGNOSIS — F32A Depression, unspecified: Secondary | ICD-10-CM

## 2024-02-12 DIAGNOSIS — Z7984 Long term (current) use of oral hypoglycemic drugs: Secondary | ICD-10-CM

## 2024-02-12 DIAGNOSIS — E782 Mixed hyperlipidemia: Secondary | ICD-10-CM

## 2024-02-12 DIAGNOSIS — J3089 Other allergic rhinitis: Secondary | ICD-10-CM

## 2024-02-12 DIAGNOSIS — N529 Male erectile dysfunction, unspecified: Secondary | ICD-10-CM

## 2024-02-12 DIAGNOSIS — K219 Gastro-esophageal reflux disease without esophagitis: Secondary | ICD-10-CM | POA: Diagnosis not present

## 2024-02-12 DIAGNOSIS — M5412 Radiculopathy, cervical region: Secondary | ICD-10-CM | POA: Diagnosis not present

## 2024-02-12 LAB — CBC WITH DIFFERENTIAL/PLATELET
Basophils Absolute: 0 K/uL (ref 0.0–0.1)
Basophils Relative: 0.5 % (ref 0.0–3.0)
Eosinophils Absolute: 0.1 K/uL (ref 0.0–0.7)
Eosinophils Relative: 2.3 % (ref 0.0–5.0)
HCT: 45.8 % (ref 39.0–52.0)
Hemoglobin: 15.1 g/dL (ref 13.0–17.0)
Lymphocytes Relative: 38.1 % (ref 12.0–46.0)
Lymphs Abs: 2.1 K/uL (ref 0.7–4.0)
MCHC: 33 g/dL (ref 30.0–36.0)
MCV: 85.9 fl (ref 78.0–100.0)
Monocytes Absolute: 0.4 K/uL (ref 0.1–1.0)
Monocytes Relative: 6.8 % (ref 3.0–12.0)
Neutro Abs: 2.9 K/uL (ref 1.4–7.7)
Neutrophils Relative %: 52.3 % (ref 43.0–77.0)
Platelets: 296 K/uL (ref 150.0–400.0)
RBC: 5.33 Mil/uL (ref 4.22–5.81)
RDW: 14.1 % (ref 11.5–15.5)
WBC: 5.6 K/uL (ref 4.0–10.5)

## 2024-02-12 LAB — COMPREHENSIVE METABOLIC PANEL WITH GFR
ALT: 26 U/L (ref 0–53)
AST: 15 U/L (ref 0–37)
Albumin: 4.3 g/dL (ref 3.5–5.2)
Alkaline Phosphatase: 44 U/L (ref 39–117)
BUN: 17 mg/dL (ref 6–23)
CO2: 28 meq/L (ref 19–32)
Calcium: 9.4 mg/dL (ref 8.4–10.5)
Chloride: 103 meq/L (ref 96–112)
Creatinine, Ser: 0.93 mg/dL (ref 0.40–1.50)
GFR: 88.57 mL/min (ref >60.00)
Glucose, Bld: 128 mg/dL — ABNORMAL HIGH (ref 70–99)
Potassium: 4 meq/L (ref 3.5–5.1)
Sodium: 137 meq/L (ref 135–145)
Total Bilirubin: 0.4 mg/dL (ref 0.2–1.2)
Total Protein: 7.5 g/dL (ref 6.0–8.3)

## 2024-02-12 LAB — MICROALBUMIN / CREATININE URINE RATIO
Creatinine,U: 114.5 mg/dL
Microalb Creat Ratio: UNDETERMINED mg/g (ref 0.0–30.0)
Microalb, Ur: <0.7 mg/dL

## 2024-02-12 LAB — VITAMIN B12: Vitamin B-12: 1174 pg/mL — ABNORMAL HIGH (ref 211–911)

## 2024-02-12 LAB — TSH: TSH: 2.97 u[IU]/mL (ref 0.35–5.50)

## 2024-02-12 LAB — HEMOGLOBIN A1C: Hgb A1c MFr Bld: 7.1 % — ABNORMAL HIGH (ref 4.6–6.5)

## 2024-02-12 MED ORDER — METFORMIN HCL 500 MG PO TABS
500.0000 mg | ORAL_TABLET | Freq: Every day | ORAL | 0 refills | Status: DC
  Filled 2024-02-12: qty 90, 90d supply, fill #0

## 2024-02-12 MED ORDER — OMEPRAZOLE 20 MG PO CPDR
20.0000 mg | DELAYED_RELEASE_CAPSULE | Freq: Every day | ORAL | 1 refills | Status: DC
  Filled 2024-02-12: qty 90, 90d supply, fill #0
  Filled 2024-05-11: qty 90, 90d supply, fill #1

## 2024-02-12 MED ORDER — ESCITALOPRAM OXALATE 20 MG PO TABS
20.0000 mg | ORAL_TABLET | Freq: Every day | ORAL | 1 refills | Status: DC
  Filled 2024-02-12: qty 90, 90d supply, fill #0
  Filled 2024-05-11: qty 90, 90d supply, fill #1

## 2024-02-12 NOTE — Assessment & Plan Note (Signed)
 He last saw Dr. Georgina last year. Gabapentin  is helpful.

## 2024-02-12 NOTE — Patient Instructions (Signed)
 Please go downstairs for labs and a urine test before you leave.  I increased your Lexapro  dose.  Let me know if you have any issues or concerns with this medication.  Continue your other medications as prescribed.  When you get your new insurance, please let us  know and we will put in referral to ophthalmology for your eye exam.  I would like to see you back in 6 weeks.

## 2024-02-12 NOTE — Progress Notes (Signed)
 His hemoglobin A1c is now slightly higher at 7.1%.  I would like for him to start taking two 500 mg metformin  tablets with breakfast.  Please send in a new prescription for the 1000 mg tablet if he prefers.  For now, he can double up on the 500 mg tablets.  No other concerns with his labs

## 2024-02-12 NOTE — Assessment & Plan Note (Signed)
Continue avoiding triggers and good lifestyle management.  Continue PPI

## 2024-02-12 NOTE — Assessment & Plan Note (Signed)
 Recommend low carbohydrate, low sugar diet and getting adequate physical activity.  He will not start checking blood sugars at this point.  Continue metformin 500 mg daily with breakfast.  He is on statin therapy.    Follow up pending A1c and renal function.  Encouraged him to call and schedule diabetes eye exam.

## 2024-02-12 NOTE — Assessment & Plan Note (Signed)
 Continue treatment

## 2024-02-12 NOTE — Assessment & Plan Note (Signed)
 Not well controlled. Increase Lexapro . Follow up if not improving.

## 2024-02-12 NOTE — Progress Notes (Signed)
 Subjective:     Patient ID: Austin Singleton, male    DOB: 19-Jul-1961, 63 y.o.   MRN: [id number]  Chief Complaint  Patient presents with   Medical Management of Chronic Issues    3 month f/u    HPI  History of Present Illness         He is here to follow-up on chronic health conditions.  DM- does not check BS at home  Taking metformin  daily  previous A1c 6.6% in April 2025.  States mood is not as good, depressed some days- on Lexapro  10 mg   He is still having issues with neck pain and occasional dizziness.   Eye exam overdue but plans to have Medicare next month and will get one   Hypertriglyceridemia-On Vytorin  and fenofibrate .  LDL 67.  C/o ED. Reports seeing urology in the past. ED medications did not work well, caused him to have pain. He did not follow up   Health Maintenance Due  Topic Date Due   OPHTHALMOLOGY EXAM  Never done   Zoster Vaccines- Shingrix (1 of 2) Never done    Past Medical History:  Diagnosis Date   Diabetes (HCC)    Hyperlipidemia     History reviewed. No pertinent surgical history.  History reviewed. No pertinent family history.  Social History   Socioeconomic History   Marital status: Married    Spouse name: Not on file   Number of children: Not on file   Years of education: Not on file   Highest education level: Not on file  Occupational History   Not on file  Tobacco Use   Smoking status: Never   Smokeless tobacco: Never  Substance and Sexual Activity   Alcohol use: Not Currently   Drug use: Never   Sexual activity: Not on file  Other Topics Concern   Not on file  Social History Narrative   Not on file   Social Drivers of Health   Financial Resource Strain: Not on file  Food Insecurity: Not on file  Transportation Needs: Not on file  Physical Activity: Not on file  Stress: Not on file  Social Connections: Not on file  Intimate Partner Violence: Not on file    Outpatient Medications Prior to Visit   Medication Sig Dispense Refill   Ascorbic Acid (VITAMIN C PO) Take by mouth.     Cyanocobalamin  (B-12 PO) Take by mouth.     ezetimibe -simvastatin  (VYTORIN ) 10-20 MG tablet Take 1 tablet by mouth at bedtime. 90 tablet 1   fenofibrate  (TRICOR ) 48 MG tablet Take 1 tablet by mouth daily. 90 tablet 1   Fexofenadine-Pseudoephedrine (ALLEGRA-D 12 HOUR PO) Take by mouth.     fluticasone  (FLONASE ) 50 MCG/ACT nasal spray Place 2 sprays into both nostrils daily. 16 g 6   gabapentin  (NEURONTIN ) 300 MG capsule Take 1 capsule (300 mg total) by mouth at bedtime. 30 capsule 2   meclizine  (ANTIVERT ) 25 MG tablet Take 1 tablet (25 mg total) by mouth 2 (two) times daily as needed for dizziness. 30 tablet 0   Misc Natural Products (URINOZINC PO) Take by mouth.     Omega-3 Fatty Acids (OMEGA 3 PO) Take by mouth.     VITAMIN D PO Take by mouth.     escitalopram  (LEXAPRO ) 10 MG tablet Take 1 tablet (10 mg total) by mouth daily. 30 tablet 4   metFORMIN  (GLUCOPHAGE ) 500 MG tablet Take 1 tablet (500 mg total) by mouth daily with breakfast. 90 tablet 0  omeprazole  (PRILOSEC) 20 MG capsule Take 1 capsule (20 mg total) by mouth daily. (Patient not taking: Reported on 02/12/2024) 90 capsule 1   No facility-administered medications prior to visit.    Not on File  Review of Systems  Constitutional:  Negative for chills, fever, malaise/fatigue and weight loss.  Eyes:  Negative for blurred vision, double vision and photophobia.  Respiratory:  Negative for cough and shortness of breath.   Cardiovascular:  Negative for chest pain, palpitations, orthopnea, claudication and leg swelling.  Gastrointestinal:  Negative for abdominal pain, constipation, diarrhea, nausea and vomiting.  Genitourinary:  Negative for dysuria, frequency and urgency.  Musculoskeletal:  Positive for neck pain.  Neurological:  Negative for dizziness, tingling, focal weakness and headaches.  Psychiatric/Behavioral:  Positive for depression. Negative  for substance abuse and suicidal ideas. The patient is not nervous/anxious and does not have insomnia.        Objective:    Physical Exam Constitutional:      General: He is not in acute distress.    Appearance: He is not ill-appearing.  HENT:     Mouth/Throat:     Mouth: Mucous membranes are moist.     Pharynx: Oropharynx is clear.  Eyes:     Extraocular Movements: Extraocular movements intact.     Conjunctiva/sclera: Conjunctivae normal.  Cardiovascular:     Rate and Rhythm: Normal rate and regular rhythm.  Pulmonary:     Effort: Pulmonary effort is normal.     Breath sounds: Normal breath sounds.  Musculoskeletal:     Cervical back: Normal range of motion and neck supple.     Right lower leg: No edema.     Left lower leg: No edema.     Right foot: Normal.     Left foot: Normal.  Skin:    General: Skin is warm and dry.  Neurological:     General: No focal deficit present.     Mental Status: He is alert and oriented to person, place, and time.     Cranial Nerves: No cranial nerve deficit.     Motor: No weakness.     Coordination: Coordination normal.  Psychiatric:        Mood and Affect: Mood normal.        Behavior: Behavior normal.        Thought Content: Thought content normal.      BP 120/84 (BP Location: Left Arm, Patient Position: Sitting)   Pulse 65   Temp 97.6 F (36.4 C) (Temporal)   Ht 5' 7 (1.702 m)   Wt 196 lb (88.9 kg)   SpO2 95%   BMI 30.70 kg/m  Wt Readings from Last 3 Encounters:  02/12/24 196 lb (88.9 kg)  11/12/23 192 lb (87.1 kg)  04/10/23 192 lb (87.1 kg)       Assessment & Plan:   Problem List Items Addressed This Visit     Cervical radiculopathy   He last saw Dr. Georgina last year. Gabapentin  is helpful.       Relevant Medications   escitalopram  (LEXAPRO ) 20 MG tablet   Depression   Not well controlled. Increase Lexapro . Follow up if not improving.       Relevant Medications   escitalopram  (LEXAPRO ) 20 MG tablet    Environmental and seasonal allergies   Continue treatment       Gastroesophageal reflux disease   Continue avoiding triggers and good lifestyle management.  Continue PPI      Relevant Medications  omeprazole  (PRILOSEC) 20 MG capsule   Other Relevant Orders   EKG 12-Lead   Hyperlipidemia   Continue current medications       Type 2 diabetes mellitus without complication, without long-term current use of insulin (HCC) - Primary    Recommend low carbohydrate, low sugar diet and getting adequate physical activity.  He will not start checking blood sugars at this point.  Continue metformin  500 mg daily with breakfast.  He is on statin therapy.    Follow up pending A1c and renal function.  Encouraged him to call and schedule diabetes eye exam.        Relevant Medications   metFORMIN  (GLUCOPHAGE ) 500 MG tablet   Other Relevant Orders   CBC with Differential/Platelet (Completed)   Comprehensive metabolic panel with GFR (Completed)   Hemoglobin A1c (Completed)   TSH (Completed)   Vitamin B12 (Completed)   Microalbumin / creatinine urine ratio (Completed)   EKG 12-Lead   Other Visit Diagnoses       Erectile dysfunction, unspecified erectile dysfunction type          EKG shows NSR, rate 63, no Q waves or acute ST-T wave changes   I have discontinued Micha Labombard's escitalopram . I am also having him start on escitalopram . Additionally, I am having him maintain his Fexofenadine-Pseudoephedrine (ALLEGRA-D 12 HOUR PO), Cyanocobalamin  (B-12 PO), VITAMIN D PO, Misc Natural Products (URINOZINC PO), Ascorbic Acid (VITAMIN C PO), Omega-3 Fatty Acids (OMEGA 3 PO), meclizine , ezetimibe -simvastatin , fluticasone , gabapentin , fenofibrate , metFORMIN , and omeprazole .  Meds ordered this encounter  Medications   metFORMIN  (GLUCOPHAGE ) 500 MG tablet    Sig: Take 1 tablet (500 mg total) by mouth daily with breakfast.    Dispense:  90 tablet    Refill:  0   omeprazole  (PRILOSEC) 20 MG capsule     Sig: Take 1 capsule (20 mg total) by mouth daily.    Dispense:  90 capsule    Refill:  1   escitalopram  (LEXAPRO ) 20 MG tablet    Sig: Take 1 tablet (20 mg total) by mouth daily.    Dispense:  90 tablet    Refill:  1    Supervising Provider:   ROLLENE NORRIS A [[id number]]

## 2024-02-12 NOTE — Assessment & Plan Note (Signed)
 Continue current medications.

## 2024-02-16 ENCOUNTER — Other Ambulatory Visit: Payer: Self-pay | Admitting: Family Medicine

## 2024-02-16 MED ORDER — METFORMIN HCL 1000 MG PO TABS
1000.0000 mg | ORAL_TABLET | Freq: Every day | ORAL | 3 refills | Status: DC
  Filled 2024-02-18: qty 90, 90d supply, fill #0
  Filled 2024-05-21: qty 90, 90d supply, fill #1

## 2024-02-18 ENCOUNTER — Other Ambulatory Visit (HOSPITAL_COMMUNITY): Payer: Self-pay

## 2024-02-18 ENCOUNTER — Telehealth: Payer: Self-pay

## 2024-02-18 NOTE — Telephone Encounter (Signed)
 Called pharmacy and confirmed they do have this rx

## 2024-02-18 NOTE — Telephone Encounter (Signed)
 Copied from CRM [id number]. Topic: Clinical - Prescription Issue >> Feb 18, 2024  8:04 AM Mesmerise C wrote: Reason for CRM: Monica from [facility] advised patient would like prescription of Metformin  1000mg  to be sent to them instead [facility] [address] [place] [address]Eynwz: [phone] Fax: [phone]: Mon-Fri 7:30am-6pm; Sat 8:00am-4:30pm

## 2024-02-24 ENCOUNTER — Other Ambulatory Visit: Payer: Self-pay | Admitting: Family Medicine

## 2024-02-24 DIAGNOSIS — E785 Hyperlipidemia, unspecified: Secondary | ICD-10-CM

## 2024-02-24 NOTE — Telephone Encounter (Unsigned)
 Copied from CRM [id number]. Topic: Clinical - Medication Refill >> Feb 24, 2024  2:49 PM Burnard DEL wrote: Medication: ezetimibe -simvastatin  (VYTORIN ) 10-20 MG tablet  Has the patient contacted their pharmacy? No (Agent: If no, request that the patient contact the pharmacy for the refill. If patient does not wish to contact the pharmacy document the reason why and proceed with request.) (Agent: If yes, when and what did the pharmacy advise?)  This is the patient's preferred pharmacy:   [facility] [address] [place] [address] Phone: [phone] Fax: [phone]  Is this the correct pharmacy for this prescription? Yes If no, delete pharmacy and type the correct one.   Has the prescription been filled recently? No  Is the patient out of the medication? Yes  Has the patient been seen for an appointment in the last year OR does the patient have an upcoming appointment? Yes  Can we respond through MyChart? Yes  Agent: Please be advised that Rx refills may take up to 3 business days. We ask that you follow-up with your pharmacy.

## 2024-02-25 ENCOUNTER — Other Ambulatory Visit (HOSPITAL_COMMUNITY): Payer: Self-pay

## 2024-02-25 MED ORDER — EZETIMIBE-SIMVASTATIN 10-20 MG PO TABS
1.0000 | ORAL_TABLET | Freq: Every day | ORAL | 1 refills | Status: DC
  Filled 2024-05-26: qty 90, 90d supply, fill #0

## 2024-02-26 ENCOUNTER — Other Ambulatory Visit (HOSPITAL_COMMUNITY): Payer: Self-pay

## 2024-02-27 ENCOUNTER — Other Ambulatory Visit (HOSPITAL_COMMUNITY): Payer: Self-pay

## 2024-03-25 ENCOUNTER — Ambulatory Visit: Admitting: Family Medicine

## 2024-04-08 ENCOUNTER — Ambulatory Visit (INDEPENDENT_AMBULATORY_CARE_PROVIDER_SITE_OTHER): Admitting: Family Medicine

## 2024-04-08 ENCOUNTER — Encounter: Payer: Self-pay | Admitting: Family Medicine

## 2024-04-08 VITALS — BP 120/84 | HR 82 | Temp 97.6°F | Ht 67.0 in | Wt 195.0 lb

## 2024-04-08 DIAGNOSIS — F32A Depression, unspecified: Secondary | ICD-10-CM

## 2024-04-08 DIAGNOSIS — N529 Male erectile dysfunction, unspecified: Secondary | ICD-10-CM

## 2024-04-08 DIAGNOSIS — Z7984 Long term (current) use of oral hypoglycemic drugs: Secondary | ICD-10-CM | POA: Diagnosis not present

## 2024-04-08 DIAGNOSIS — Z23 Encounter for immunization: Secondary | ICD-10-CM

## 2024-04-08 DIAGNOSIS — E119 Type 2 diabetes mellitus without complications: Secondary | ICD-10-CM | POA: Diagnosis not present

## 2024-04-08 NOTE — Progress Notes (Signed)
 Subjective:     Patient ID: Austin Singleton, male    DOB: 09-12-1960, 63 y.o.   MRN: [id number]  Chief Complaint  Patient presents with   Medical Management of Chronic Issues    6 week f/u    HPI  Discussed the use of AI scribe software for clinical note transcription with the patient, who gave verbal consent to proceed.  History of Present Illness Austin Singleton is a 63 year old male with depression and diabetes who presents for follow-up on chronic health conditions.  Depressive symptoms - Experiences dizziness with escitalopram  (Lexapro ), after 2 doses leading to a reduced dose - Currently taking escitalopram  at a reduced dose  Glycemic control - A1c has increased - Metformin  dosage has been increased - Tolerates metformin  well  Erectile dysfunction - Experiences erectile dysfunction - Requests urology referral, which has not been pursued in the past ten years  Preventive care - Plans to schedule an eye exam now that he has Medicare coverage      Health Maintenance Due  Topic Date Due   Medicare Annual Wellness (AWV)  Never done   OPHTHALMOLOGY EXAM  Never done   Zoster Vaccines- Shingrix (1 of 2) Never done    Past Medical History:  Diagnosis Date   Diabetes (HCC)    Hyperlipidemia     No past surgical history on file.  No family history on file.  Social History   Socioeconomic History   Marital status: Married    Spouse name: Not on file   Number of children: Not on file   Years of education: Not on file   Highest education level: Not on file  Occupational History   Not on file  Tobacco Use   Smoking status: Never   Smokeless tobacco: Never  Substance and Sexual Activity   Alcohol use: Not Currently   Drug use: Never   Sexual activity: Not on file  Other Topics Concern   Not on file  Social History Narrative   Not on file   Social Drivers of Health   Financial Resource Strain: Not on file  Food Insecurity: Not on file   Transportation Needs: Not on file  Physical Activity: Not on file  Stress: Not on file  Social Connections: Not on file  Intimate Partner Violence: Not on file    Outpatient Medications Prior to Visit  Medication Sig Dispense Refill   Ascorbic Acid (VITAMIN C PO) Take by mouth.     Cyanocobalamin  (B-12 PO) Take by mouth.     escitalopram  (LEXAPRO ) 20 MG tablet Take 1 tablet (20 mg total) by mouth daily. 90 tablet 1   ezetimibe -simvastatin  (VYTORIN ) 10-20 MG tablet Take 1 tablet by mouth at bedtime. 90 tablet 1   fenofibrate  (TRICOR ) 48 MG tablet Take 1 tablet by mouth daily. 90 tablet 1   Fexofenadine-Pseudoephedrine (ALLEGRA-D 12 HOUR PO) Take by mouth.     fluticasone  (FLONASE ) 50 MCG/ACT nasal spray Place 2 sprays into both nostrils daily. 16 g 6   gabapentin  (NEURONTIN ) 300 MG capsule Take 1 capsule (300 mg total) by mouth at bedtime. 30 capsule 2   meclizine  (ANTIVERT ) 25 MG tablet Take 1 tablet (25 mg total) by mouth 2 (two) times daily as needed for dizziness. 30 tablet 0   metFORMIN  (GLUCOPHAGE ) 1000 MG tablet Take 1 tablet (1,000 mg total) by mouth daily with breakfast. 90 tablet 3   Misc Natural Products (URINOZINC PO) Take by mouth.     Omega-3 Fatty Acids (  OMEGA 3 PO) Take by mouth.     omeprazole  (PRILOSEC) 20 MG capsule Take 1 capsule (20 mg total) by mouth daily. 90 capsule 1   VITAMIN D PO Take by mouth.     No facility-administered medications prior to visit.    Not on File  Review of Systems  Constitutional:  Negative for chills and fever.  Respiratory:  Negative for shortness of breath.   Cardiovascular:  Negative for chest pain, palpitations and leg swelling.  Gastrointestinal:  Negative for abdominal pain, constipation, diarrhea, nausea and vomiting.  Genitourinary:  Negative for dysuria, frequency and urgency.  Neurological:  Negative for dizziness, focal weakness and headaches.  Psychiatric/Behavioral:  Negative for depression and suicidal ideas. The  patient is not nervous/anxious.        Objective:    Physical Exam Constitutional:      General: He is not in acute distress.    Appearance: He is not ill-appearing.  Eyes:     Extraocular Movements: Extraocular movements intact.     Conjunctiva/sclera: Conjunctivae normal.  Cardiovascular:     Rate and Rhythm: Normal rate.  Pulmonary:     Effort: Pulmonary effort is normal.  Musculoskeletal:     Cervical back: Normal range of motion and neck supple.  Skin:    General: Skin is warm and dry.  Neurological:     General: No focal deficit present.     Mental Status: He is alert and oriented to person, place, and time.  Psychiatric:        Mood and Affect: Mood normal.        Behavior: Behavior normal.        Thought Content: Thought content normal.      BP 120/84   Pulse 82   Temp 97.6 F (36.4 C) (Temporal)   Ht 5' 7 (1.702 m)   Wt 195 lb (88.5 kg)   SpO2 96%   BMI 30.54 kg/m  Wt Readings from Last 3 Encounters:  04/08/24 195 lb (88.5 kg)  02/12/24 196 lb (88.9 kg)  11/12/23 192 lb (87.1 kg)       Assessment & Plan:   Problem List Items Addressed This Visit   None Visit Diagnoses       Need for influenza vaccination    -  Primary   Relevant Orders   Flu vaccine trivalent PF, 6mos and older(Flulaval,Afluria,Fluarix,Fluzone) (Completed)       Assessment and Plan Assessment & Plan Type 2 diabetes mellitus Type 2 diabetes mellitus with a recent increase in A1c, prompting an increase in metformin  dosage. Currently well-managed on the increased dose. - Continue metformin  1000 mg daily  - Schedule an eye exam with a referral now that he has Medicare.  Depression Depression. Experienced dizziness after two days on an increased dose of escitalopram , leading to a reduction back to 10 mg. Plan to attempt increase again with monitoring. - Attempt to increase escitalopram  to 20 mg again, monitoring for dizziness. If dizziness occurs, revert to 10 mg.  Male  erectile dysfunction Male erectile dysfunction with discomfort and pain with previous treatments. No recent urology consultations in the past ten years. Previous PSA was normal in May of last year. - Refer to urology for evaluation and management.  Follow-Up Follow-up for ongoing management of chronic conditions and coordination of care with specialists. - Schedule a 'Welcome to Medicare' visit in December, 40-minute appointment. - Ensure follow-up calls from urology and eye doctor offices.      I  am having Hezzie Code maintain his Fexofenadine-Pseudoephedrine (ALLEGRA-D 12 HOUR PO), Cyanocobalamin  (B-12 PO), VITAMIN D PO, Misc Natural Products (URINOZINC PO), Ascorbic Acid (VITAMIN C PO), Omega-3 Fatty Acids (OMEGA 3 PO), meclizine , fluticasone , gabapentin , fenofibrate , omeprazole , escitalopram , metFORMIN , and ezetimibe -simvastatin .  No orders of the defined types were placed in this encounter.

## 2024-04-08 NOTE — Patient Instructions (Signed)
 Continue metformin  1,000 mg daily.   Increase Lexapro  to 20 mg again and see how you feel.   I referred you to [facility] and an Ophthalmologist.  They will both call you to schedule.

## 2024-05-11 ENCOUNTER — Other Ambulatory Visit (HOSPITAL_COMMUNITY): Payer: Self-pay

## 2024-05-17 ENCOUNTER — Other Ambulatory Visit (HOSPITAL_COMMUNITY): Payer: Self-pay

## 2024-05-21 ENCOUNTER — Other Ambulatory Visit (HOSPITAL_COMMUNITY): Payer: Self-pay

## 2024-05-21 ENCOUNTER — Other Ambulatory Visit: Payer: Self-pay | Admitting: Family Medicine

## 2024-05-23 ENCOUNTER — Other Ambulatory Visit (HOSPITAL_COMMUNITY): Payer: Self-pay

## 2024-05-23 MED ORDER — FENOFIBRATE 48 MG PO TABS
ORAL_TABLET | ORAL | 1 refills | Status: AC
  Filled 2024-05-23: qty 90, 90d supply, fill #0
  Filled 2024-09-06: qty 90, 90d supply, fill #1

## 2024-05-26 ENCOUNTER — Other Ambulatory Visit (HOSPITAL_COMMUNITY): Payer: Self-pay

## 2024-06-21 DIAGNOSIS — Z008 Encounter for other general examination: Secondary | ICD-10-CM | POA: Diagnosis not present

## 2024-07-20 ENCOUNTER — Other Ambulatory Visit (HOSPITAL_COMMUNITY): Payer: Self-pay

## 2024-07-26 ENCOUNTER — Other Ambulatory Visit: Payer: Self-pay

## 2024-07-26 ENCOUNTER — Other Ambulatory Visit (HOSPITAL_COMMUNITY): Payer: Self-pay

## 2024-07-26 ENCOUNTER — Encounter: Payer: Self-pay | Admitting: Family Medicine

## 2024-07-26 ENCOUNTER — Ambulatory Visit (INDEPENDENT_AMBULATORY_CARE_PROVIDER_SITE_OTHER): Admitting: Family Medicine

## 2024-07-26 VITALS — BP 112/74 | HR 75 | Temp 97.9°F | Ht 67.0 in | Wt 195.0 lb

## 2024-07-26 DIAGNOSIS — E119 Type 2 diabetes mellitus without complications: Secondary | ICD-10-CM

## 2024-07-26 DIAGNOSIS — E785 Hyperlipidemia, unspecified: Secondary | ICD-10-CM | POA: Diagnosis not present

## 2024-07-26 DIAGNOSIS — J3089 Other allergic rhinitis: Secondary | ICD-10-CM

## 2024-07-26 DIAGNOSIS — F32A Depression, unspecified: Secondary | ICD-10-CM

## 2024-07-26 DIAGNOSIS — E782 Mixed hyperlipidemia: Secondary | ICD-10-CM

## 2024-07-26 DIAGNOSIS — K219 Gastro-esophageal reflux disease without esophagitis: Secondary | ICD-10-CM

## 2024-07-26 DIAGNOSIS — N529 Male erectile dysfunction, unspecified: Secondary | ICD-10-CM

## 2024-07-26 DIAGNOSIS — Z7984 Long term (current) use of oral hypoglycemic drugs: Secondary | ICD-10-CM

## 2024-07-26 MED ORDER — OMEPRAZOLE 20 MG PO CPDR
20.0000 mg | DELAYED_RELEASE_CAPSULE | Freq: Every day | ORAL | 1 refills | Status: AC
  Filled 2024-07-26: qty 90, 90d supply, fill #0

## 2024-07-26 MED ORDER — FREESTYLE LIBRE 3 PLUS SENSOR MISC
3 refills | Status: AC
  Filled 2024-07-26: qty 6, 90d supply, fill #0

## 2024-07-26 MED ORDER — FLUTICASONE PROPIONATE 50 MCG/ACT NA SUSP
2.0000 | Freq: Every day | NASAL | 6 refills | Status: AC
  Filled 2024-07-26: qty 16, 30d supply, fill #0

## 2024-07-26 MED ORDER — EZETIMIBE-SIMVASTATIN 10-20 MG PO TABS
1.0000 | ORAL_TABLET | Freq: Every day | ORAL | 1 refills | Status: AC
  Filled 2024-07-26 – 2024-09-06 (×2): qty 90, 90d supply, fill #0

## 2024-07-26 MED ORDER — METFORMIN HCL 1000 MG PO TABS
1000.0000 mg | ORAL_TABLET | Freq: Every day | ORAL | 3 refills | Status: AC
  Filled 2024-07-26 – 2024-07-29 (×2): qty 90, 90d supply, fill #0

## 2024-07-26 MED ORDER — ESCITALOPRAM OXALATE 20 MG PO TABS
20.0000 mg | ORAL_TABLET | Freq: Every day | ORAL | 1 refills | Status: AC
  Filled 2024-07-26: qty 90, 90d supply, fill #0

## 2024-07-26 MED ORDER — TADALAFIL 5 MG PO TABS
10.0000 mg | ORAL_TABLET | Freq: Every day | ORAL | 0 refills | Status: AC | PRN
  Filled 2024-07-26: qty 30, 15d supply, fill #0

## 2024-07-26 NOTE — Patient Instructions (Addendum)
" °  Please start checking your blood sugars.  Let me know if you have issues getting the blood sugar monitor and supplies.  You can try Cialis  2 pills daily as needed.  Take 30 to 45 minutes before sexual activity.  Do not exceed more than 20 mg in a 24-hour period.  Please call [facility] and [facility].  Please call to schedule  [facility] [address] [place], [place], [place] [address] Phone: [phone]  [facility] [address] [place] [place] [address] [phone] "

## 2024-07-26 NOTE — Progress Notes (Signed)
 "  Subjective:     Patient ID: Austin Singleton, male    DOB: 1960-10-05, 63 y.o.   MRN: [id number]  Chief Complaint  Patient presents with   Medical Management of Chronic Issues    3 month f/u    HPI  Discussed the use of AI scribe software for clinical note transcription with the patient, who gave verbal consent to proceed.  History of Present Illness Austin Singleton is a 63 year old male with diabetes and depression who presents for a follow-up on chronic health conditions. He is accompanied by his wife.   Diabetes mellitus - Not checking blood glucose at home due to lack of glucometer - Takes metformin  1000 mg with breakfast - Last hemoglobin A1c in July was 7.1% - No recent eye exam, but Medicare home retinopathy screening was normal  Depressive symptoms - Mood has improved - No current symptoms of depression - Takes escitalopram  (Lexapro ) 20 mg daily - No dizziness reported with medication  Erectile dysfunction - Previously used Viagra, which caused painful erections - Cialis  was effective without pain - Avoiding ED medications recently due to prior side effects  Hyperlipidemia - Takes fenofibrate  and Vytorin  for cholesterol management - He is not fasting today   Other medications - Uses omeprazole  and gabapentin  as needed - Recently refilled Flonase  nasal spray     Health Maintenance Due  Topic Date Due   Medicare Annual Wellness (AWV)  Never done   OPHTHALMOLOGY EXAM  Never done   Zoster Vaccines- Shingrix (1 of 2) Never done    Past Medical History:  Diagnosis Date   Diabetes (HCC)    Hyperlipidemia     History reviewed. No pertinent surgical history.  History reviewed. No pertinent family history.  Social History   Socioeconomic History   Marital status: Married    Spouse name: Not on file   Number of children: Not on file   Years of education: Not on file   Highest education level: Not on file  Occupational History   Not on file  Tobacco  Use   Smoking status: Never   Smokeless tobacco: Never  Substance and Sexual Activity   Alcohol use: Not Currently   Drug use: Never   Sexual activity: Not on file  Other Topics Concern   Not on file  Social History Narrative   Not on file   Social Drivers of Health   Tobacco Use: Low Risk (07/26/2024)   Patient History    Smoking Tobacco Use: Never    Smokeless Tobacco Use: Never    Passive Exposure: Not on file  Financial Resource Strain: Not on file  Food Insecurity: Not on file  Transportation Needs: Not on file  Physical Activity: Not on file  Stress: Not on file  Social Connections: Not on file  Intimate Partner Violence: Not on file  Depression (PHQ2-9): Low Risk (07/26/2024)   Depression (PHQ2-9)    PHQ-2 Score: 0  Alcohol Screen: Not on file  Housing: Not on file  Utilities: Not on file  Health Literacy: Not on file    Outpatient Medications Prior to Visit  Medication Sig Dispense Refill   Ascorbic Acid (VITAMIN C PO) Take by mouth.     Cyanocobalamin  (B-12 PO) Take by mouth.     fenofibrate  (TRICOR ) 48 MG tablet Take 1 tablet by mouth daily. 90 tablet 1   Fexofenadine-Pseudoephedrine (ALLEGRA-D 12 HOUR PO) Take by mouth.     gabapentin  (NEURONTIN ) 300 MG capsule Take 1 capsule (  300 mg total) by mouth at bedtime. 30 capsule 2   meclizine  (ANTIVERT ) 25 MG tablet Take 1 tablet (25 mg total) by mouth 2 (two) times daily as needed for dizziness. 30 tablet 0   Misc Natural Products (URINOZINC PO) Take by mouth.     Omega-3 Fatty Acids (OMEGA 3 PO) Take by mouth.     VITAMIN D PO Take by mouth.     escitalopram  (LEXAPRO ) 20 MG tablet Take 1 tablet (20 mg total) by mouth daily. 90 tablet 1   ezetimibe -simvastatin  (VYTORIN ) 10-20 MG tablet Take 1 tablet by mouth at bedtime. 90 tablet 1   fluticasone  (FLONASE ) 50 MCG/ACT nasal spray Place 2 sprays into both nostrils daily. 16 g 6   metFORMIN  (GLUCOPHAGE ) 1000 MG tablet Take 1 tablet (1,000 mg total) by mouth daily  with breakfast. 90 tablet 3   omeprazole  (PRILOSEC) 20 MG capsule Take 1 capsule (20 mg total) by mouth daily. 90 capsule 1   No facility-administered medications prior to visit.    Allergies[1]  Review of Systems  Constitutional:  Negative for chills, fever and malaise/fatigue.  Eyes:  Negative for blurred vision and double vision.  Respiratory:  Negative for shortness of breath.   Cardiovascular:  Negative for chest pain, palpitations and leg swelling.  Gastrointestinal:  Negative for abdominal pain, constipation, diarrhea, nausea and vomiting.  Genitourinary:  Negative for dysuria, frequency and urgency.  Musculoskeletal:  Positive for joint pain.  Neurological:  Negative for dizziness, focal weakness and headaches.  Psychiatric/Behavioral:  Negative for depression. The patient is not nervous/anxious.        Objective:    Physical Exam Constitutional:      General: He is not in acute distress.    Appearance: He is not ill-appearing.  Eyes:     Extraocular Movements: Extraocular movements intact.     Conjunctiva/sclera: Conjunctivae normal.  Cardiovascular:     Rate and Rhythm: Normal rate.  Pulmonary:     Effort: Pulmonary effort is normal.  Musculoskeletal:     Cervical back: Normal range of motion and neck supple.  Skin:    General: Skin is warm and dry.  Neurological:     General: No focal deficit present.     Mental Status: He is alert and oriented to person, place, and time.  Psychiatric:        Mood and Affect: Mood normal.        Behavior: Behavior normal.        Thought Content: Thought content normal.      BP 112/74   Pulse 75   Temp 97.9 F (36.6 C) (Temporal)   Ht 5' 7 (1.702 m)   Wt 195 lb (88.5 kg)   SpO2 96%   BMI 30.54 kg/m  Wt Readings from Last 3 Encounters:  07/26/24 195 lb (88.5 kg)  04/08/24 195 lb (88.5 kg)  02/12/24 196 lb (88.9 kg)       Assessment & Plan:   Problem List Items Addressed This Visit     Depression    Relevant Medications   escitalopram  (LEXAPRO ) 20 MG tablet   Environmental and seasonal allergies   Relevant Medications   fluticasone  (FLONASE ) 50 MCG/ACT nasal spray   Gastroesophageal reflux disease   Relevant Medications   omeprazole  (PRILOSEC) 20 MG capsule   Hyperlipidemia   Relevant Medications   ezetimibe -simvastatin  (VYTORIN ) 10-20 MG tablet   tadalafil  (CIALIS ) 5 MG tablet   Type 2 diabetes mellitus without complication, without long-term current  use of insulin (HCC) - Primary   Relevant Medications   metFORMIN  (GLUCOPHAGE ) 1000 MG tablet   Continuous Glucose Sensor (FREESTYLE LIBRE 3 PLUS SENSOR) MISC   Other Visit Diagnoses       Erectile dysfunction, unspecified erectile dysfunction type           Assessment and Plan Assessment & Plan Type 2 diabetes mellitus Recent A1c of 7.1% in July. No recent eye exam, but retinopathy screening was normal. Discussed continuous glucose monitoring options, but insurance coverage may be an issue due to lack of insulin use. - Will schedule comprehensive eye exam with [facility]. - Will order labs including A1c and cholesterol at follow-up visit in four weeks. - Attempt to obtain continuous glucose monitor if covered by insurance; otherwise, use finger stick method.  Depression Well-managed with escitalopram  20 mg daily. Mood has improved, and no dizziness reported. - Continue escitalopram  20 mg oral daily.  Male erectile dysfunction Previous painful erections with Viagra. Cialis  was effective without side effects. Referral to urology for further evaluation of possible BPH. - Prescribed Cialis  10 mg daily as needed for erectile dysfunction. - Referred to urology for evaluation of possible BPH. - Will check PSA at upcoming Medicare wellness visit.  Mixed hyperlipidemia Managed with ezetimibe -simvastatin  and fenofibrate . Medications are refilled, and cholesterol levels will be checked at follow-up visit. - Continue  ezetimibe -simvastatin  (Vytorin ) 10-20 mg oral bedtime. - Continue fenofibrate  (Tricor ) 48 mg oral daily. - Will check cholesterol levels at follow-up visit.  Allergic rhinitis Managed with fluticasone  nasal spray. Medication refills are adequate. - Continue fluticasone  (Flonase ) 50 mcg/act nasal daily.  General Health Maintenance Flu shot received this year. Welcome to Medicare visit scheduled for preventive health care. - Scheduled Welcome to Medicare visit in one month for preventive health care.     I am having Hezzie Code start on [employer] 3 Plus Sensor and tadalafil . I am also having him maintain his Fexofenadine-Pseudoephedrine (ALLEGRA-D 12 HOUR PO), Cyanocobalamin  (B-12 PO), VITAMIN D PO, Misc Natural Products (URINOZINC PO), Ascorbic Acid (VITAMIN C PO), Omega-3 Fatty Acids (OMEGA 3 PO), meclizine , gabapentin , fenofibrate , ezetimibe -simvastatin , escitalopram , metFORMIN , omeprazole , and fluticasone .  Meds ordered this encounter  Medications   ezetimibe -simvastatin  (VYTORIN ) 10-20 MG tablet    Sig: Take 1 tablet by mouth at bedtime.    Dispense:  90 tablet    Refill:  1    Supervising Provider:   ROLLENE NORRIS A [[id number]]   escitalopram  (LEXAPRO ) 20 MG tablet    Sig: Take 1 tablet (20 mg total) by mouth daily.    Dispense:  90 tablet    Refill:  1    Supervising Provider:   ROLLENE NORRIS A [[id number]]   metFORMIN  (GLUCOPHAGE ) 1000 MG tablet    Sig: Take 1 tablet (1,000 mg total) by mouth daily with breakfast.    Dispense:  90 tablet    Refill:  3    Supervising Provider:   ROLLENE NORRIS A [[id number]]   omeprazole  (PRILOSEC) 20 MG capsule    Sig: Take 1 capsule (20 mg total) by mouth daily.    Dispense:  90 capsule    Refill:  1    Supervising Provider:   ROLLENE NORRIS A [[id number]]   fluticasone  (FLONASE ) 50 MCG/ACT nasal spray    Sig: Place 2 sprays into both nostrils daily.    Dispense:  16 g    Refill:  6    Supervising Provider:   ROLLENE NORRIS A [[id number]]   Continuous  Glucose Sensor (FREESTYLE LIBRE 3 PLUS SENSOR) MISC    Sig: Change sensor every 15 days.    Dispense:  6 each    Refill:  3   tadalafil  (CIALIS ) 5 MG tablet    Sig: Take 2 tablets (10 mg total) by mouth daily as needed for erectile dysfunction. Max dose 20mg /24 hrs    Dispense:  30 tablet    Refill:  0    Supervising Provider:   ROLLENE NORRIS A [[id number]]       [1] Not on File  "

## 2024-07-29 ENCOUNTER — Other Ambulatory Visit (HOSPITAL_COMMUNITY): Payer: Self-pay

## 2024-07-30 ENCOUNTER — Other Ambulatory Visit (HOSPITAL_COMMUNITY): Payer: Self-pay

## 2024-08-30 ENCOUNTER — Encounter: Admitting: Family Medicine

## 2024-09-06 ENCOUNTER — Other Ambulatory Visit: Payer: Self-pay

## 2024-09-06 ENCOUNTER — Other Ambulatory Visit (HOSPITAL_COMMUNITY): Payer: Self-pay

## 2024-09-07 ENCOUNTER — Other Ambulatory Visit (HOSPITAL_COMMUNITY): Payer: Self-pay
